# Patient Record
Sex: Female | Born: 1955 | Race: White | Hispanic: No | Marital: Married | Smoking: Current every day smoker
Health system: Southern US, Community
[De-identification: ages and names within clinical notes are randomized; demographics above are authoritative.]

## PROBLEM LIST (undated history)

## (undated) DIAGNOSIS — J45909 Unspecified asthma, uncomplicated: Secondary | ICD-10-CM

## (undated) DIAGNOSIS — I82409 Acute embolism and thrombosis of unspecified deep veins of unspecified lower extremity: Secondary | ICD-10-CM

## (undated) DIAGNOSIS — I809 Phlebitis and thrombophlebitis of unspecified site: Secondary | ICD-10-CM

## (undated) DIAGNOSIS — H9190 Unspecified hearing loss, unspecified ear: Secondary | ICD-10-CM

## (undated) DIAGNOSIS — F329 Major depressive disorder, single episode, unspecified: Secondary | ICD-10-CM

## (undated) DIAGNOSIS — F419 Anxiety disorder, unspecified: Secondary | ICD-10-CM

## (undated) DIAGNOSIS — I499 Cardiac arrhythmia, unspecified: Secondary | ICD-10-CM

## (undated) DIAGNOSIS — G43909 Migraine, unspecified, not intractable, without status migrainosus: Secondary | ICD-10-CM

## (undated) DIAGNOSIS — M1990 Unspecified osteoarthritis, unspecified site: Secondary | ICD-10-CM

## (undated) DIAGNOSIS — H539 Unspecified visual disturbance: Secondary | ICD-10-CM

## (undated) DIAGNOSIS — E079 Disorder of thyroid, unspecified: Secondary | ICD-10-CM

## (undated) DIAGNOSIS — F32A Depression, unspecified: Secondary | ICD-10-CM

## (undated) HISTORY — DX: Migraine, unspecified, not intractable, without status migrainosus: G43.909

## (undated) HISTORY — DX: Unspecified visual disturbance: H53.9

## (undated) HISTORY — DX: Unspecified hearing loss, unspecified ear: H91.90

## (undated) HISTORY — DX: Disorder of thyroid, unspecified: E07.9

## (undated) HISTORY — DX: Unspecified asthma, uncomplicated: J45.909

## (undated) HISTORY — DX: Cardiac arrhythmia, unspecified: I49.9

## (undated) HISTORY — DX: Major depressive disorder, single episode, unspecified: F32.9

## (undated) HISTORY — DX: Depression, unspecified: F32.A

## (undated) HISTORY — DX: Anxiety disorder, unspecified: F41.9

## (undated) HISTORY — DX: Unspecified osteoarthritis, unspecified site: M19.90

---

## 2000-06-11 ENCOUNTER — Emergency Department (HOSPITAL_COMMUNITY): Admission: EM | Admit: 2000-06-11 | Discharge: 2000-06-11 | Payer: Self-pay | Admitting: *Deleted

## 2011-10-03 ENCOUNTER — Encounter: Payer: Self-pay | Admitting: Internal Medicine

## 2011-10-10 ENCOUNTER — Ambulatory Visit: Payer: Self-pay | Admitting: Internal Medicine

## 2011-10-24 ENCOUNTER — Encounter: Payer: Self-pay | Admitting: Internal Medicine

## 2011-10-30 ENCOUNTER — Ambulatory Visit: Payer: Self-pay | Admitting: Internal Medicine

## 2012-01-17 ENCOUNTER — Ambulatory Visit: Payer: Self-pay

## 2012-02-26 ENCOUNTER — Ambulatory Visit: Payer: Self-pay | Admitting: Family Medicine

## 2012-02-26 ENCOUNTER — Encounter: Payer: Self-pay | Admitting: Family Medicine

## 2012-02-26 VITALS — BP 115/60 | HR 96 | Temp 98.1°F | Resp 18

## 2012-02-26 DIAGNOSIS — R059 Cough, unspecified: Secondary | ICD-10-CM

## 2012-02-26 DIAGNOSIS — G4700 Insomnia, unspecified: Secondary | ICD-10-CM

## 2012-02-26 DIAGNOSIS — J209 Acute bronchitis, unspecified: Secondary | ICD-10-CM

## 2012-02-26 DIAGNOSIS — R05 Cough: Secondary | ICD-10-CM

## 2012-02-26 MED ORDER — LORAZEPAM 2 MG PO TABS: 2.0000 mg | ORAL_TABLET | Freq: Four times a day (QID) | ORAL | Status: AC | PRN

## 2012-02-26 MED ORDER — ERYTHROMYCIN BASE 333 MG PO TBEC: 333.0000 mg | DELAYED_RELEASE_TABLET | Freq: Three times a day (TID) | ORAL | Status: DC

## 2012-02-26 MED ORDER — LORAZEPAM 1 MG PO TABS: 2.0000 mg | ORAL_TABLET | Freq: Three times a day (TID) | ORAL | Status: DC | PRN

## 2012-02-26 MED ORDER — PREDNISONE 20 MG PO TABS: ORAL_TABLET | ORAL | Status: DC

## 2012-02-26 NOTE — Patient Instructions (Signed)
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
## 2012-04-06 ENCOUNTER — Ambulatory Visit (INDEPENDENT_AMBULATORY_CARE_PROVIDER_SITE_OTHER): Payer: Self-pay | Admitting: Family Medicine

## 2012-04-06 VITALS — BP 115/68 | HR 102 | Temp 98.4°F | Resp 16 | Ht 63.5 in | Wt 114.4 lb

## 2012-04-06 DIAGNOSIS — F329 Major depressive disorder, single episode, unspecified: Secondary | ICD-10-CM

## 2012-04-06 DIAGNOSIS — F339 Major depressive disorder, recurrent, unspecified: Secondary | ICD-10-CM

## 2012-04-06 DIAGNOSIS — F32A Depression, unspecified: Secondary | ICD-10-CM

## 2012-04-06 DIAGNOSIS — H6090 Unspecified otitis externa, unspecified ear: Secondary | ICD-10-CM

## 2012-04-06 MED ORDER — ERYTHROMYCIN BASE 250 MG PO TABS: 250.0000 mg | ORAL_TABLET | Freq: Four times a day (QID) | ORAL | Status: DC

## 2012-04-06 NOTE — Progress Notes (Signed)
56 yo woman with joint pains, sinus congestion, and ongoing stress.  She is very discouraged.  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 Married, intermittently employed.  Epigastric pain was relieved by Nexium last month  O:  Alert, NAD  Patient is tearful. Skin clear of rashes Eyes: normal Oroph:  Clear posterior pharynx, poor dentition TM's:  Cerumen Neck:  Supple, no adenopathy, no thyromegaly Chest:  Clear Heart:  Reg, intermittent S4, no murmur Abd:  Soft, nontender, no HSM Ext:  Good pulses, no swollen or deformed joints  Mental status:  Difficult to evaluate, but clearly depressed and has given up trying to work.  A:  Depressed middle aged woman seeking disability  P:  [phone]  She needs social service help  Nexium 40 qd refilled.

## 2012-04-09 ENCOUNTER — Telehealth: Payer: Self-pay

## 2012-04-09 NOTE — Telephone Encounter (Signed)
Dr. Elbert Ewings to asses this as the note is unclear.  Loretta Copeland

## 2012-04-09 NOTE — Telephone Encounter (Signed)
Pharmacist called to ask if another med can be substituted for what dr l prescribed due to cost. Please call [facility]  [phone]

## 2012-04-10 MED ORDER — SULFAMETHOXAZOLE-TRIMETHOPRIM 800-160 MG PO TABS: 1.0000 | ORAL_TABLET | Freq: Two times a day (BID) | ORAL | Status: DC

## 2012-04-10 NOTE — Telephone Encounter (Signed)
Called Bactrim Rx into [facility]

## 2012-04-10 NOTE — Telephone Encounter (Signed)
Addended by: [name] on: 04/10/2012 04:01 PM   Modules accepted: Orders

## 2012-04-10 NOTE — Telephone Encounter (Signed)
Called pharm back and they stated the med that was too expensive was the erythromycin 250 that was Rxd 4/22. It costs over $100 and pt did not get it. Can we subst another Abx?

## 2012-04-10 NOTE — Telephone Encounter (Signed)
Please call in Prilosec 20 mg qd #30 with 5 refills and Prozac 20 mg #30 qd with 5 refills

## 2012-05-07 ENCOUNTER — Encounter: Payer: Self-pay | Admitting: Family Medicine

## 2012-08-24 ENCOUNTER — Ambulatory Visit (INDEPENDENT_AMBULATORY_CARE_PROVIDER_SITE_OTHER): Payer: Self-pay | Admitting: Family Medicine

## 2012-08-24 VITALS — BP 103/69 | HR 93 | Temp 97.9°F | Resp 16 | Ht 64.0 in | Wt 110.0 lb

## 2012-08-24 DIAGNOSIS — F411 Generalized anxiety disorder: Secondary | ICD-10-CM

## 2012-08-24 DIAGNOSIS — F419 Anxiety disorder, unspecified: Secondary | ICD-10-CM

## 2012-08-24 MED ORDER — LORAZEPAM 2 MG PO TABS: 2.0000 mg | ORAL_TABLET | Freq: Four times a day (QID) | ORAL | Status: DC | PRN

## 2012-08-24 MED ORDER — ERYTHROMYCIN BASE 333 MG PO TBEC: 333.0000 mg | DELAYED_RELEASE_TABLET | Freq: Three times a day (TID) | ORAL | Status: AC

## 2012-08-24 NOTE — Progress Notes (Signed)
56 yo woman with anxiety who is here for refill of Ativan 2 mg qid.  Objective:  NAD

## 2012-10-08 ENCOUNTER — Ambulatory Visit: Payer: Self-pay | Admitting: Family Medicine

## 2012-10-27 ENCOUNTER — Ambulatory Visit: Payer: Self-pay | Admitting: Family Medicine

## 2012-10-27 VITALS — BP 118/70 | HR 118 | Temp 98.6°F | Resp 18 | Ht 64.5 in | Wt 118.0 lb

## 2012-10-27 DIAGNOSIS — H9209 Otalgia, unspecified ear: Secondary | ICD-10-CM

## 2012-10-27 MED ORDER — NEOMYCIN-POLYMYXIN-HC 3.5-10000-1 OT SOLN: 3.0000 [drp] | Freq: Four times a day (QID) | OTIC | Status: DC

## 2012-10-27 MED ORDER — ERYTHROMYCIN BASE 333 MG PO TBEC: 333.0000 mg | DELAYED_RELEASE_TABLET | Freq: Three times a day (TID) | ORAL | Status: DC

## 2012-10-27 NOTE — Patient Instructions (Signed)
Otitis Media You or your child has otitis media. This is an infection of the middle chamber of the ear. This condition is common in young children and often follows upper respiratory infections. Symptoms of otitis media may include earache or ear fullness, hearing loss, or fever. If the eardrum ruptures, a middle ear infection may also cause bloody or pus-like discharge from the ear. Fussiness, irritability, and persistent crying may be the only signs of otitis media in small children. Otitis media can be caused by a bacteria or a virus. Antibiotics may be used to treat bacterial otitis media. But antibiotics are not effective against viral infections. Not every case of bacterial otitis media requires antibiotics and depending on age, severity of infection, and other risk factors, observation may be all that is required. Ear drops or oral medicines may be prescribed to reduce pain, fever, or congestion. Babies with ear infections should not be fed while lying on their backs. This increases the pressure and pain in the ear. Do not put cotton in the ear canal or clean it with cotton swabs. Swimming should be avoided if the eardrum has ruptured or if there is drainage from the ear canal. If your child experiences recurrent infections, your child may need to be referred to an Ear, Nose, and Throat specialist. HOME CARE INSTRUCTIONS   Take any antibiotic as directed by your caregiver. You or your child may feel better in a few days, but take all medicine or the infection may not respond and may become more difficult to treat.   Only take over-the-counter or prescription medicines for pain, discomfort, or fever as directed by your caregiver. Do not give aspirin to children.  Otitis media can lead to complications including rupture of the eardrum, long-term hearing loss, and more severe infections. Call your caregiver for follow-up care at the end of treatment. SEEK IMMEDIATE MEDICAL CARE IF:   Your or your  child's problems do not improve within 2 to 3 days.   You or your child has an oral temperature above 102 F (38.9 C), not controlled by medicine.   Your baby is older than 3 months with a rectal temperature of 102 F (38.9 C) or higher.   Your baby is 53 months old or younger with a rectal temperature of 100.4 F (38 C) or higher.   Your child develops increased fussiness.   You or your child develops a stiff neck, severe headache, or confusion.   There is swelling around the ear.   There is dizziness, vomiting, unusual sleepiness, seizures, or twitching of facial muscles.   The pain or ear drainage persists beyond 2 days of antibiotic treatment.  Document Released: 01/09/2005 Document Revised: 11/21/2011 Document Reviewed: 03/30/2010 [facility] Patient Information 2012 [place], [place].Otalgia The most common reason for this in children is an infection of the middle ear. Pain from the middle ear is usually caused by a build-up of fluid and pressure behind the eardrum. Pain from an earache can be sharp, dull, or burning. The pain may be temporary or constant. The middle ear is connected to the nasal passages by a short narrow tube called the Eustachian tube. The Eustachian tube allows fluid to drain out of the middle ear, and helps keep the pressure in your ear equalized. CAUSES  A cold or allergy can block the Eustachian tube with inflammation and the build-up of secretions. This is especially likely in small children, because their Eustachian tube is shorter and more horizontal. When the Eustachian tube  closes, the normal flow of fluid from the middle ear is stopped. Fluid can accumulate and cause stuffiness, pain, hearing loss, and an ear infection if germs start growing in this area. SYMPTOMS  The symptoms of an ear infection may include fever, ear pain, fussiness, increased crying, and irritability. Many children will have temporary and minor hearing loss during and right after an ear  infection. Permanent hearing loss is rare, but the risk increases the more infections a child has. Other causes of ear pain include retained water in the outer ear canal from swimming and bathing. Ear pain in adults is less likely to be from an ear infection. Ear pain may be referred from other locations. Referred pain may be from the joint between your jaw and the skull. It may also come from a tooth problem or problems in the neck. Other causes of ear pain include:  A foreign body in the ear.  Outer ear infection.  Sinus infections.  Impacted ear wax.  Ear injury.  Arthritis of the jaw or TMJ problems.  Middle ear infection.  Tooth infections.  Sore throat with pain to the ears. DIAGNOSIS  Your caregiver can usually make the diagnosis by examining you. Sometimes other special studies, including x-rays and lab work may be necessary. TREATMENT   If antibiotics were prescribed, use them as directed and finish them even if you or your child's symptoms seem to be improved.  Sometimes PE tubes are needed in children. These are little plastic tubes which are put into the eardrum during a simple surgical procedure. They allow fluid to drain easier and allow the pressure in the middle ear to equalize. This helps relieve the ear pain caused by pressure changes. HOME CARE INSTRUCTIONS   Only take over-the-counter or prescription medicines for pain, discomfort, or fever as directed by your caregiver. DO NOT GIVE CHILDREN ASPIRIN because of the association of Reye's Syndrome in children taking aspirin.  Use a cold pack applied to the outer ear for 15 to 20 minutes, 3 to 4 times per day or as needed may reduce pain. Do not apply ice directly to the skin. You may cause frost bite.  Over-the-counter ear drops used as directed may be effective. Your caregiver may sometimes prescribe ear drops.  Resting in an upright position may help reduce pressure in the middle ear and relieve pain.  Ear pain  caused by rapidly descending from high altitudes can be relieved by swallowing or chewing gum. Allowing infants to suck on a bottle during airplane travel can help.  Do not smoke in the house or near children. If you are unable to quit smoking, smoke outside.  Control allergies. SEEK IMMEDIATE MEDICAL CARE IF:   You or your child are becoming sicker.  Pain or fever relief is not obtained with medicine.  You or your child's symptoms (pain, fever, or irritability) do not improve within 24 to 48 hours or as instructed.  Severe pain suddenly stops hurting. This may indicate a ruptured eardrum.  You or your children develop new problems such as severe headaches, stiff neck, difficulty swallowing, or swelling of the face or around the ear. Document Released: 07/19/2004 Document Revised: 02/24/2012 Document Reviewed: 11/23/2008 [facility] Patient Information 2013 [place], [place].

## 2012-10-27 NOTE — Progress Notes (Signed)
56 yo woman with left ear pain x 4 days. No fever  Patient prefers erythromycin  O: left ear retracted, mildly erythematous  A:  Otitis media

## 2012-12-16 DIAGNOSIS — Z0271 Encounter for disability determination: Secondary | ICD-10-CM

## 2013-03-02 ENCOUNTER — Ambulatory Visit: Payer: Self-pay | Admitting: Family Medicine

## 2013-03-02 VITALS — BP 106/78 | HR 89 | Temp 98.2°F | Resp 18 | Ht 64.25 in | Wt 121.6 lb

## 2013-03-02 DIAGNOSIS — F419 Anxiety disorder, unspecified: Secondary | ICD-10-CM

## 2013-03-02 DIAGNOSIS — F411 Generalized anxiety disorder: Secondary | ICD-10-CM

## 2013-03-02 DIAGNOSIS — H9203 Otalgia, bilateral: Secondary | ICD-10-CM

## 2013-03-02 DIAGNOSIS — H9209 Otalgia, unspecified ear: Secondary | ICD-10-CM

## 2013-03-02 MED ORDER — LORAZEPAM 2 MG PO TABS: 2.0000 mg | ORAL_TABLET | Freq: Four times a day (QID) | ORAL | Status: DC | PRN

## 2013-03-02 MED ORDER — LORAZEPAM 1 MG PO TABS: ORAL_TABLET | ORAL | Status: DC

## 2013-03-02 MED ORDER — ERYTHROMYCIN BASE 333 MG PO TBEC: 333.0000 mg | DELAYED_RELEASE_TABLET | Freq: Three times a day (TID) | ORAL | Status: DC

## 2013-03-02 NOTE — Progress Notes (Signed)
This is a 57 yo unhappy woman from the [place] who comes in about every 6 months for a refill on her Ativan 2 mg qid.  She is in poor health, smokes at least a pack a day, and has an unhappy marriage.  O:  NAD Edentulous, no lesions in mouth Neck:  No adenopathy Chest: clear Heart: reg, no murmur Skin: no rash  Assessment:  Chronic sinusitis, chronic anxiety.

## 2013-05-26 ENCOUNTER — Encounter: Payer: Self-pay | Admitting: Internal Medicine

## 2013-06-01 ENCOUNTER — Other Ambulatory Visit: Payer: Self-pay | Admitting: Obstetrics and Gynecology

## 2013-06-01 DIAGNOSIS — R2232 Localized swelling, mass and lump, left upper limb: Secondary | ICD-10-CM

## 2013-06-03 ENCOUNTER — Ambulatory Visit (INDEPENDENT_AMBULATORY_CARE_PROVIDER_SITE_OTHER): Payer: No Typology Code available for payment source | Admitting: Family Medicine

## 2013-06-03 VITALS — BP 106/64 | HR 99 | Temp 98.2°F | Resp 16 | Ht 65.0 in | Wt 112.4 lb

## 2013-06-03 DIAGNOSIS — H6690 Otitis media, unspecified, unspecified ear: Secondary | ICD-10-CM

## 2013-06-03 DIAGNOSIS — F419 Anxiety disorder, unspecified: Secondary | ICD-10-CM

## 2013-06-03 DIAGNOSIS — R109 Unspecified abdominal pain: Secondary | ICD-10-CM

## 2013-06-03 DIAGNOSIS — H6692 Otitis media, unspecified, left ear: Secondary | ICD-10-CM

## 2013-06-03 DIAGNOSIS — F411 Generalized anxiety disorder: Secondary | ICD-10-CM

## 2013-06-03 LAB — POCT URINALYSIS DIPSTICK
Blood, UA: NEGATIVE
Glucose, UA: NEGATIVE
Ketones, UA: 15
Leukocytes, UA: NEGATIVE
Nitrite, UA: NEGATIVE
Spec Grav, UA: >=1.03
Urobilinogen, UA: 0.2
pH, UA: 5

## 2013-06-03 MED ORDER — LORAZEPAM 2 MG PO TABS: 2.0000 mg | ORAL_TABLET | Freq: Four times a day (QID) | ORAL | Status: DC | PRN

## 2013-06-03 NOTE — Patient Instructions (Signed)
Take Mylanta 1 tbsp (15 ml) three times a day

## 2013-06-03 NOTE — Progress Notes (Signed)
57 yo woman with epigastric discomfort since this morning.  She says she needs refill but I gave her 5 refills last visit.  She  Is taking lorazepam 2 mg every 6 hours.  She has taken prilosec which hasn't worked.  Unable to afford the Nexium.  Daughter and 49 mo old son are living with her.  She notes baby is not sleeping well and keeping her awake.  Objective:  Difficult to understand, but patient in no distress.  She seems unhappy having daughter with her Chest:  Clear Heart: regular, no murmur Abdomen: mild tenderness hypogastrium, no mass, no guarding, no rebound Skin: no rash Bilateral TM erythema and retraction Results for orders placed in visit on 06/03/13  POCT URINALYSIS DIPSTICK      Result Value Range   Color, UA yellow     Clarity, UA clear     Glucose, UA neg     Bilirubin, UA small     Ketones, UA 15     Spec Grav, UA >=1.030     Blood, UA neg     pH, UA 5.0     Protein, UA trace     Urobilinogen, UA 0.2     Nitrite, UA neg     Leukocytes, UA Negative      Assessment:  Poor health secondary to poor diet and smoking.  Lower abdominal pain which is mild and should resolve. Otitis infection  Plan: Continue the lorazepam 2 mg qid prn Mylanta cipro 250 bid x 10 days for ear  Signed, [name], Md

## 2013-06-14 ENCOUNTER — Other Ambulatory Visit: Payer: Self-pay

## 2013-06-21 ENCOUNTER — Ambulatory Visit: Payer: Self-pay | Admitting: Internal Medicine

## 2013-08-31 ENCOUNTER — Ambulatory Visit (INDEPENDENT_AMBULATORY_CARE_PROVIDER_SITE_OTHER): Payer: No Typology Code available for payment source | Admitting: Family Medicine

## 2013-08-31 ENCOUNTER — Ambulatory Visit (INDEPENDENT_AMBULATORY_CARE_PROVIDER_SITE_OTHER): Payer: No Typology Code available for payment source

## 2013-08-31 VITALS — BP 114/68 | HR 95 | Temp 99.6°F | Resp 17 | Ht 65.0 in | Wt 111.0 lb

## 2013-08-31 DIAGNOSIS — R05 Cough: Secondary | ICD-10-CM

## 2013-08-31 DIAGNOSIS — R059 Cough, unspecified: Secondary | ICD-10-CM

## 2013-08-31 DIAGNOSIS — J209 Acute bronchitis, unspecified: Secondary | ICD-10-CM

## 2013-08-31 DIAGNOSIS — H9203 Otalgia, bilateral: Secondary | ICD-10-CM

## 2013-08-31 DIAGNOSIS — F419 Anxiety disorder, unspecified: Secondary | ICD-10-CM

## 2013-08-31 DIAGNOSIS — F411 Generalized anxiety disorder: Secondary | ICD-10-CM

## 2013-08-31 DIAGNOSIS — H9209 Otalgia, unspecified ear: Secondary | ICD-10-CM

## 2013-08-31 MED ORDER — ERYTHROMYCIN BASE 333 MG PO TBEC: 333.0000 mg | DELAYED_RELEASE_TABLET | Freq: Three times a day (TID) | ORAL | Status: DC

## 2013-08-31 MED ORDER — NEOMYCIN-POLYMYXIN-HC 3.5-10000-1 OT SOLN: 3.0000 [drp] | Freq: Four times a day (QID) | OTIC | Status: DC

## 2013-08-31 MED ORDER — LORAZEPAM 2 MG PO TABS: 2.0000 mg | ORAL_TABLET | Freq: Four times a day (QID) | ORAL | Status: DC | PRN

## 2013-08-31 MED ORDER — HYDROCODONE-HOMATROPINE 5-1.5 MG/5ML PO SYRP: 5.0000 mL | ORAL_SOLUTION | Freq: Three times a day (TID) | ORAL | Status: DC | PRN

## 2013-08-31 NOTE — Progress Notes (Signed)
Patient ID: Loretta Copeland MRN: [id number], DOB: September 15, 1956, 57 y.o. Date of Encounter: 08/31/2013, 6:48 PM  Primary Physician: No PCP Per Patient  Chief Complaint:  Chief Complaint  Patient presents with  . Headache    HPI: 57 y.o. year old female presents with 14 day history of nasal congestion, post nasal drip, sore throat, sinus pressure, and cough. Afebrile. No chills. Nasal congestion thick and green/yellow. Sinus pressure is the worst symptom. Cough is productive secondary to post nasal drip and not associated with time of day. Ears feel full, leading to sensation of muffled hearing. Has tried OTC cold preps without success. No GI complaints. Appetite poor  No recent antibiotics, recent travels, or sick contacts   No leg trauma, sedentary periods, h/o cancer, or tobacco use.  Past Medical History  Diagnosis Date  . Migraines   . Arthritis   . Depression      Home Meds: Prior to Admission medications   Medication Sig Start Date End Date Taking? Authorizing Provider  LORazepam (ATIVAN) 2 MG tablet Take 1 tablet (2 mg total) by mouth every 6 (six) hours as needed for anxiety. 06/03/13  Yes [name], MD    Allergies:  Allergies  Allergen Reactions  . Penicillins   . Tetracyclines & Related     History   Social History  . Marital Status: Married    Spouse Name: N/A    Number of Children: N/A  . Years of Education: N/A   Occupational History  . Not on file.   Social History Main Topics  . Smoking status: Current Every Day Smoker -- 1.00 packs/day for 30 years    Types: Cigarettes  . Smokeless tobacco: Not on file  . Alcohol Use: No  . Drug Use: No  . Sexual Activity: No   Other Topics Concern  . Not on file   Social History Narrative  . No narrative on file     Review of Systems: Constitutional: negative for chills, fever, night sweats or weight changes Cardiovascular: negative for chest pain or palpitations Respiratory: negative for  hemoptysis, wheezing, or shortness of breath Abdominal: negative for abdominal pain, nausea, vomiting or diarrhea Dermatological: negative for rash Neurologic: negative for headache   Physical Exam: Blood pressure 114/68, pulse 95, temperature 99.6 F (37.6 C), temperature source Oral, resp. rate 17, height 5\' 5"  (1.651 m), weight 111 lb (50.349 kg), SpO2 99.00%., Body mass index is 18.47 kg/(m^2). General: Well developed, well nourished, in no acute distress. Head: Normocephalic, atraumatic, eyes without discharge, sclera non-icteric, nares are congested. Bilateral auditory canals clear, TM's are without perforation, pearly grey with reflective cone of light bilaterally. Serous effusion bilaterally behind TM's. Maxillary sinus TTP. Oral cavity moist, dentition normal. Posterior pharynx with post nasal drip and mild erythema. No peritonsillar abscess or tonsillar exudate. Neck: Supple. No thyromegaly. Full ROM. No lymphadenopathy. Lungs: bilateral to auscultation wit wheezes, no rales, or rhonchi. Breathing is unlabored.  Heart: RRR with S1 S2. No murmurs, rubs, or gallops appreciated. Msk:  Strength and tone normal for age. Extremities: No clubbing or cyanosis. No edema. Neuro: Alert and oriented X 3. Moves all extremities spontaneously. CNII-XII grossly in tact. Psych:  Responds to questions appropriately with a normal affect.   Labs:   ASSESSMENT AND PLAN:  57 y.o. year old female with sinusitis, anxiety, depression Otalgia, bilateral - Plan: erythromycin (ERY-TAB) 333 MG EC tablet, neomycin-polymyxin-hydrocortisone (CORTISPORIN) otic solution  Anxiety - Plan: LORazepam (ATIVAN) 2 MG tablet  Cough -  Plan: HYDROcodone-homatropine (HYCODAN) 5-1.5 MG/5ML syrup   -  -Tylenol/Motrin prn -Rest/fluids -RTC precautions -RTC 3-5 days if no improvement  Signed, [name], MD 08/31/2013 6:48 PM

## 2013-10-13 ENCOUNTER — Encounter: Payer: No Typology Code available for payment source | Admitting: Family Medicine

## 2013-12-22 ENCOUNTER — Ambulatory Visit (INDEPENDENT_AMBULATORY_CARE_PROVIDER_SITE_OTHER): Payer: BC Managed Care – PPO | Admitting: Family Medicine

## 2013-12-22 VITALS — BP 122/80 | HR 95 | Temp 98.6°F | Resp 16 | Ht 65.0 in | Wt 115.0 lb

## 2013-12-22 DIAGNOSIS — R059 Cough, unspecified: Secondary | ICD-10-CM

## 2013-12-22 DIAGNOSIS — H9209 Otalgia, unspecified ear: Secondary | ICD-10-CM

## 2013-12-22 DIAGNOSIS — F411 Generalized anxiety disorder: Secondary | ICD-10-CM

## 2013-12-22 DIAGNOSIS — R05 Cough: Secondary | ICD-10-CM

## 2013-12-22 DIAGNOSIS — F419 Anxiety disorder, unspecified: Secondary | ICD-10-CM

## 2013-12-22 DIAGNOSIS — J329 Chronic sinusitis, unspecified: Secondary | ICD-10-CM

## 2013-12-22 DIAGNOSIS — H9203 Otalgia, bilateral: Secondary | ICD-10-CM

## 2013-12-22 MED ORDER — ERYTHROMYCIN BASE 333 MG PO TBEC: 333.0000 mg | DELAYED_RELEASE_TABLET | Freq: Three times a day (TID) | ORAL | Status: DC

## 2013-12-22 MED ORDER — LORAZEPAM 2 MG PO TABS: 2.0000 mg | ORAL_TABLET | Freq: Four times a day (QID) | ORAL | Status: DC | PRN

## 2013-12-22 MED ORDER — HYDROCODONE-HOMATROPINE 5-1.5 MG/5ML PO SYRP: 5.0000 mL | ORAL_SOLUTION | Freq: Three times a day (TID) | ORAL | Status: DC | PRN

## 2013-12-22 MED ORDER — SULFAMETHOXAZOLE-TRIMETHOPRIM 800-160 MG PO TABS: 1.0000 | ORAL_TABLET | Freq: Two times a day (BID) | ORAL | Status: DC

## 2013-12-22 NOTE — Patient Instructions (Signed)

## 2013-12-22 NOTE — Progress Notes (Signed)
Subjective:  This chart was scribed for Loretta SidleKurt Lauenstein, MD by Loretta Copeland, Medical Scribe. This patient was seen in Room 3 and the patient's care was started at 6:20 PM.   Patient ID: Loretta Copeland, female    DOB: 30-Jan-1956, [age] y.o.   MRN: [id number]  HPI HPI Comments: Loretta AllegraBiljana Copeland is a 58 y.o. female who presents to the [facility] complaining of constant nasal congestion and sinus pressure.  The patient lists myalgias and bilateral otalgia as associated symptoms.  The patient is also complaining of constant right shoulder pain that started a three days ago.  The patient lists back pain and neck pain as associated symptoms.  The patient states that she also needs a refill of Ativan.  She states that the Erythromycin causes her to have abdominal pain and she would like to try another medication.  The patient states that she is allergic to Penicillins and Tetracyclines.   Past Medical History  Diagnosis Date   Migraines    Arthritis    Depression    History reviewed. No pertinent past surgical history. Family History  Problem Relation Age of Onset   Diabetes Mother    Diabetes Father    Diabetes Brother    Diabetes Daughter    History   Social History   Marital Status: Married    Spouse Name: N/A    Number of Children: N/A   Years of Education: N/A   Occupational History   Not on file.   Social History Main Topics   Smoking status: Current Every Day Smoker -- 1.00 packs/day for 30 years    Types: Cigarettes   Smokeless tobacco: Not on file   Alcohol Use: No   Drug Use: No   Sexual Activity: No   Other Topics Concern   Not on file   Social History Narrative   No narrative on file   Allergies  Allergen Reactions   Penicillins    Tetracyclines & Related      Review of Systems  HENT: Positive for congestion, ear pain (bilateral) and sinus pressure.   Musculoskeletal: Positive for arthralgias (right shoulder), back  pain, myalgias and neck pain.  All other systems reviewed and are negative.     Objective:  Physical Exam  Nursing note and vitals reviewed. Constitutional: She is oriented to person, place, and time. She appears well-developed and well-nourished. No distress.  HENT:  Head: Normocephalic and atraumatic.  Eyes: EOM are normal. Pupils are equal, round, and reactive to light.  Neck: Normal range of motion and phonation normal.  Cardiovascular: Normal rate.   Pulmonary/Chest: Effort normal.  Abdominal: Soft.  Musculoskeletal: Normal range of motion.  Neurological: She is alert and oriented to person, place, and time. No cranial nerve deficit. She exhibits normal muscle tone. Coordination normal.  Skin: Skin is warm and dry.  Psychiatric: She has a normal mood and affect. Her behavior is normal. Judgment and thought content normal.       BP 122/80   Pulse 95   Temp(Src) 98.6 F (37 C) (Oral)   Resp 16   Ht 5\' 5"  (1.651 m)   Wt 115 lb (52.164 kg)   BMI 19.14 kg/m2   SpO2 96% Assessment & Plan:   Meds ordered this encounter  Medications   LORazepam (ATIVAN) 2 MG tablet    Sig: Take 1 tablet (2 mg total) by mouth every 6 (six) hours as needed for anxiety.    Dispense:  120  tablet    Refill:  5   DISCONTD: sulfamethoxazole-trimethoprim (BACTRIM DS,SEPTRA DS) 800-160 MG per tablet    Sig: Take 1 tablet by mouth 2 (two) times daily.    Dispense:  20 tablet    Refill:  0   HYDROcodone-homatropine (HYCODAN) 5-1.5 MG/5ML syrup    Sig: Take 5 mLs by mouth every 8 (eight) hours as needed for cough.    Dispense:  120 mL    Refill:  0   erythromycin (ERY-TAB) 333 MG EC tablet    Sig: Take 1 tablet (333 mg total) by mouth 3 (three) times daily.    Dispense:  21 tablet    Refill:  0    1. Anxiety   2. Otalgia, bilateral   3. Sinusitis    I personally performed the services described in this documentation, which was scribed in my presence. The recorded information has been  reviewed and is accurate.  Loretta Sidle, MD

## 2013-12-29 ENCOUNTER — Ambulatory Visit: Payer: No Typology Code available for payment source | Admitting: Family Medicine

## 2014-01-06 ENCOUNTER — Encounter: Payer: No Typology Code available for payment source | Admitting: Family Medicine

## 2014-03-17 ENCOUNTER — Ambulatory Visit: Payer: BC Managed Care – PPO | Admitting: Family Medicine

## 2014-03-18 NOTE — Progress Notes (Signed)
Not seen

## 2014-05-17 ENCOUNTER — Telehealth: Payer: Self-pay

## 2014-05-17 NOTE — Telephone Encounter (Signed)
LMOM that I didn't see where anyone called her and to give [place] a call back

## 2014-05-17 NOTE — Telephone Encounter (Signed)
Patient states she received a missed call. Please return call and advise. Thank you.

## 2014-06-07 ENCOUNTER — Ambulatory Visit (INDEPENDENT_AMBULATORY_CARE_PROVIDER_SITE_OTHER): Payer: BC Managed Care – PPO | Admitting: Family Medicine

## 2014-06-07 VITALS — BP 100/60 | HR 96 | Temp 98.9°F | Resp 24 | Ht 64.0 in | Wt 117.8 lb

## 2014-06-07 DIAGNOSIS — H6060 Unspecified chronic otitis externa, unspecified ear: Secondary | ICD-10-CM

## 2014-06-07 DIAGNOSIS — R05 Cough: Secondary | ICD-10-CM

## 2014-06-07 DIAGNOSIS — H6062 Unspecified chronic otitis externa, left ear: Secondary | ICD-10-CM

## 2014-06-07 DIAGNOSIS — F419 Anxiety disorder, unspecified: Secondary | ICD-10-CM

## 2014-06-07 DIAGNOSIS — F411 Generalized anxiety disorder: Secondary | ICD-10-CM

## 2014-06-07 DIAGNOSIS — H608X9 Other otitis externa, unspecified ear: Secondary | ICD-10-CM

## 2014-06-07 DIAGNOSIS — R059 Cough, unspecified: Secondary | ICD-10-CM

## 2014-06-07 MED ORDER — SULFAMETHOXAZOLE-TRIMETHOPRIM 800-160 MG PO TABS: 1.0000 | ORAL_TABLET | Freq: Two times a day (BID) | ORAL | Status: DC

## 2014-06-07 MED ORDER — HYDROCODONE-HOMATROPINE 5-1.5 MG/5ML PO SYRP: 5.0000 mL | ORAL_SOLUTION | Freq: Three times a day (TID) | ORAL | Status: DC | PRN

## 2014-06-07 MED ORDER — LORAZEPAM 2 MG PO TABS: 2.0000 mg | ORAL_TABLET | Freq: Four times a day (QID) | ORAL | Status: DC | PRN

## 2014-06-07 NOTE — Progress Notes (Addendum)
° °  Subjective:    Patient ID: Loretta Copeland, female    DOB: 08/19/1956, 58 y.o.   MRN: [id number] This chart was scribed for Elvina SidleKurt Lauenstein, MD by Evon Slackerrance Branch, ED Scribe. This Patient was seen in room 01 and the patients care was started at 7:52 PM  HPI Loretta Copeland is a 58 y.o. female She present to urgent medical with a disability papers. She states she has a history of chronic back pain. She states she would like help with the chronic back pain. She states she also has some pain in the right leg. She was previously diagnosed wit sciatica. She also present with a knee wrap on the right knee to help with pain. She states she also states she has been having ear pain in her left ear with associated discharge.    Review of Systems  HENT: Positive for ear discharge and ear pain.   Musculoskeletal: Positive for arthralgias and back pain.     Objective:    Physical Exam  HENT:  Left Ear: Tympanic membrane is perforated.   Bilateral wheezes inspiratory and respiratory  Mildly sore in LLQ Positive straight leg raise on left Pain with flexion on right knee, diminished reflex in left ankle jerk compared to right ankle No significant muscle wasting of either leg   Assessment & Plan:   Cough - Plan: HYDROcodone-homatropine (HYCODAN) 5-1.5 MG/5ML syrup  Anxiety - Plan: LORazepam (ATIVAN) 2 MG tablet Septra DS 1 twice a day for week has been called in for the urine infection Signed, Elvina SidleKurt Lauenstein, MD   Cover letter for the patient certifying that she is disabled because of her multiple problems.

## 2014-11-21 ENCOUNTER — Ambulatory Visit (INDEPENDENT_AMBULATORY_CARE_PROVIDER_SITE_OTHER): Payer: BC Managed Care – PPO | Admitting: Family Medicine

## 2014-11-21 VITALS — BP 128/80 | HR 87 | Temp 99.3°F | Resp 16 | Ht 64.5 in | Wt 134.4 lb

## 2014-11-21 DIAGNOSIS — R05 Cough: Secondary | ICD-10-CM

## 2014-11-21 DIAGNOSIS — F419 Anxiety disorder, unspecified: Secondary | ICD-10-CM

## 2014-11-21 DIAGNOSIS — H65493 Other chronic nonsuppurative otitis media, bilateral: Secondary | ICD-10-CM

## 2014-11-21 DIAGNOSIS — R059 Cough, unspecified: Secondary | ICD-10-CM

## 2014-11-21 MED ORDER — SULFAMETHOXAZOLE-TRIMETHOPRIM 800-160 MG PO TABS: 1.0000 | ORAL_TABLET | Freq: Two times a day (BID) | ORAL | Status: DC

## 2014-11-21 MED ORDER — HYDROCODONE-HOMATROPINE 5-1.5 MG/5ML PO SYRP: 5.0000 mL | ORAL_SOLUTION | Freq: Three times a day (TID) | ORAL | Status: DC | PRN

## 2014-11-21 MED ORDER — LORAZEPAM 2 MG PO TABS: 2.0000 mg | ORAL_TABLET | Freq: Four times a day (QID) | ORAL | Status: DC | PRN

## 2014-11-21 NOTE — Progress Notes (Signed)
° °  Subjective:    Patient ID: Loretta Copeland, female    DOB: 1956-09-23, 58 y.o.   MRN: [id number]  HPI Chief Complaint  Patient presents with   Ear Pain   This chart was scribed for Elvina SidleKurt Lauenstein, MD by Andrew Auaven Small, ED Scribe. This patient was seen in room  and the patient's care was started at 6:24 PM.  HPI Comments: Loretta Copeland is a 58 y.o. female who presents to the [facility] complaining of bilateral otalgia with associated cough. Pt has hx of chronic otitis media.   Pt is also requesting a refill of Ativan.   Past Medical History  Diagnosis Date   Migraines    Arthritis    Depression    Allergies  Allergen Reactions   Penicillins    Tetracyclines & Related    Prior to Admission medications   Medication Sig Start Date End Date Taking? Authorizing Provider  HYDROcodone-homatropine (HYCODAN) 5-1.5 MG/5ML syrup Take 5 mLs by mouth every 8 (eight) hours as needed for cough. 11/21/14  Yes Elvina SidleKurt Lauenstein, MD  LORazepam (ATIVAN) 2 MG tablet Take 1 tablet (2 mg total) by mouth every 6 (six) hours as needed for anxiety. 11/21/14  Yes Elvina SidleKurt Lauenstein, MD  sulfamethoxazole-trimethoprim (SEPTRA DS) 800-160 MG per tablet Take 1 tablet by mouth 2 (two) times daily. 11/21/14  Yes Elvina SidleKurt Lauenstein, MD    Review of Systems  HENT: Positive for ear pain.   Respiratory: Positive for cough.        Objective:   Physical Exam  Constitutional: She is oriented to person, place, and time. She appears well-developed and well-nourished. No distress.  HENT:  Head: Normocephalic and atraumatic.  Eyes: Conjunctivae and EOM are normal.  Neck: Neck supple.  Cardiovascular: Normal rate.   Pulmonary/Chest: Effort normal.  Musculoskeletal: Normal range of motion.  Neurological: She is alert and oriented to person, place, and time.  Skin: Skin is warm and dry.  Psychiatric: She has a normal mood and affect. Her behavior is normal.  Nursing note and vitals  reviewed.  Moderate thickening both TM's with retraction       Assessment & Plan:   1. Anxiety   2. 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 Cough    Meds ordered this encounter  Medications   LORazepam (ATIVAN) 2 MG tablet    Sig: Take 1 tablet (2 mg total) by mouth every 6 (six) hours as needed for anxiety.    Dispense:  120 tablet    Refill:  5   sulfamethoxazole-trimethoprim (SEPTRA DS) 800-160 MG per tablet    Sig: Take 1 tablet by mouth 2 (two) times daily.    Dispense:  14 tablet    Refill:  3   HYDROcodone-homatropine (HYCODAN) 5-1.5 MG/5ML syrup    Sig: Take 5 mLs by mouth every 8 (eight) hours as needed for cough.    Dispense:  120 mL    Refill:  0    This chart was scribed in my presence and reviewed by me personally. Elvina SidleKurt Lauenstein

## 2015-04-18 ENCOUNTER — Ambulatory Visit (INDEPENDENT_AMBULATORY_CARE_PROVIDER_SITE_OTHER): Payer: BLUE CROSS/BLUE SHIELD | Admitting: Family Medicine

## 2015-04-18 VITALS — BP 130/70 | HR 92 | Temp 97.9°F | Ht 64.5 in | Wt 138.4 lb

## 2015-04-18 DIAGNOSIS — F419 Anxiety disorder, unspecified: Secondary | ICD-10-CM | POA: Diagnosis not present

## 2015-04-18 DIAGNOSIS — R059 Cough, unspecified: Secondary | ICD-10-CM

## 2015-04-18 DIAGNOSIS — H65192 Other acute nonsuppurative otitis media, left ear: Secondary | ICD-10-CM

## 2015-04-18 DIAGNOSIS — R05 Cough: Secondary | ICD-10-CM | POA: Diagnosis not present

## 2015-04-18 MED ORDER — LEVOFLOXACIN 500 MG PO TABS: 500.0000 mg | ORAL_TABLET | Freq: Every day | ORAL | Status: DC

## 2015-04-18 MED ORDER — HYDROCODONE-HOMATROPINE 5-1.5 MG/5ML PO SYRP: 5.0000 mL | ORAL_SOLUTION | Freq: Three times a day (TID) | ORAL | Status: DC | PRN

## 2015-04-18 MED ORDER — LORAZEPAM 2 MG PO TABS: 2.0000 mg | ORAL_TABLET | Freq: Four times a day (QID) | ORAL | Status: DC | PRN

## 2015-04-18 NOTE — Patient Instructions (Signed)

## 2015-04-18 NOTE — Progress Notes (Signed)
Subjective:  This chart was scribed for [name] MD, by Veverly Fells, at [facility].  This patient was seen in room 2 and the patient's care was started at 6:10 PM.    Patient ID: Loretta Copeland, female    DOB: Dec 15, 1956, 59 y.o.   MRN: [id number] Chief Complaint  Patient presents with   Ear Pain    c/o left ear problems off & on for a while (some pain)    HPI  HPI Comments: Loretta Copeland is a 59 y.o. female who presents to [facility] for intermittent left ear pain onset a while ago as well as a cough.  She notes that she has tried drops but they have not helped her yet.  Patient notes that she is happy with her Lorazepam which she is compliant with. She is also requesting cough syrup for her cough. She has no other complaints today.    There are no active problems to display for this patient.  Past Medical History  Diagnosis Date   Migraines    Arthritis    Depression    No past surgical history on file. Allergies  Allergen Reactions   Penicillins    Tetracyclines & Related    Prior to Admission medications   Medication Sig Start Date End Date Taking? Authorizing Provider  LORazepam (ATIVAN) 2 MG tablet Take 1 tablet (2 mg total) by mouth every 6 (six) hours as needed for anxiety. 11/21/14  Yes [name], MD   History   Social History   Marital Status: Married    Spouse Name: N/A   Number of Children: N/A   Years of Education: N/A   Occupational History   Not on file.   Social History Main Topics   Smoking status: Current Every Day Smoker -- 1.00 packs/day for 30 years    Types: Cigarettes   Smokeless tobacco: Not on file     Comment: not smoking every day   Alcohol Use: No   Drug Use: No   Sexual Activity: No   Other Topics Concern   Not on file   Social History Narrative     Review of Systems  Constitutional: Negative for fever, chills and diaphoresis.  HENT: Positive for  ear pain.   Respiratory: Positive for cough. Negative for choking.   Gastrointestinal: Negative for nausea and vomiting.       Objective:   Physical Exam  Constitutional: She appears well-developed and well-nourished. No distress.  HENT:  Head: Normocephalic and atraumatic.  Eyes: Right eye exhibits no discharge. Left eye exhibits no discharge.  Pulmonary/Chest: Effort normal. No respiratory distress.  Neurological: She is alert. Coordination normal.  Skin: No rash noted. She is not diaphoretic.  Psychiatric: She has a normal mood and affect. Her behavior is normal.  Nursing note and vitals reviewed.  left ear retracted with redness over the malleus  Filed Vitals:   04/18/15 1753  BP: 130/70  Pulse: 92  Temp: 97.9 F (36.6 C)  TempSrc: Oral  Height: 5' 4.5" (1.638 m)  Weight: 138 lb 6 oz (62.766 kg)  SpO2: 97%       Assessment & Plan:   This chart was scribed in my presence and reviewed by me personally.    ICD-9-CM ICD-10-CM   1. Acute nonsuppurative otitis media of left ear 381.00 H65.192 levofloxacin (LEVAQUIN) 500 MG tablet     DISCONTINUED: levofloxacin (LEVAQUIN) 500 MG tablet  2. Anxiety 300.00 F41.9 LORazepam (  ATIVAN) 2 MG tablet  3. Cough 786.2 R05 HYDROcodone-homatropine (HYCODAN) 5-1.5 MG/5ML syrup     Signed, Elvina SidleKurt Lauenstein, MD

## 2015-07-22 ENCOUNTER — Ambulatory Visit (INDEPENDENT_AMBULATORY_CARE_PROVIDER_SITE_OTHER): Payer: BLUE CROSS/BLUE SHIELD | Admitting: Emergency Medicine

## 2015-07-22 ENCOUNTER — Telehealth: Payer: Self-pay | Admitting: Family Medicine

## 2015-07-22 ENCOUNTER — Ambulatory Visit (INDEPENDENT_AMBULATORY_CARE_PROVIDER_SITE_OTHER): Payer: BLUE CROSS/BLUE SHIELD

## 2015-07-22 ENCOUNTER — Other Ambulatory Visit: Payer: Self-pay | Admitting: Emergency Medicine

## 2015-07-22 VITALS — BP 128/70 | HR 118 | Temp 97.8°F | Resp 18 | Ht 64.0 in | Wt 136.6 lb

## 2015-07-22 DIAGNOSIS — J209 Acute bronchitis, unspecified: Secondary | ICD-10-CM | POA: Diagnosis not present

## 2015-07-22 DIAGNOSIS — R05 Cough: Secondary | ICD-10-CM | POA: Diagnosis not present

## 2015-07-22 DIAGNOSIS — R918 Other nonspecific abnormal finding of lung field: Secondary | ICD-10-CM

## 2015-07-22 DIAGNOSIS — R059 Cough, unspecified: Secondary | ICD-10-CM

## 2015-07-22 MED ORDER — HYDROCOD POLST-CPM POLST ER 10-8 MG/5ML PO SUER: 5.0000 mL | Freq: Two times a day (BID) | ORAL | Status: DC

## 2015-07-22 MED ORDER — CLARITHROMYCIN 500 MG PO TABS: 500.0000 mg | ORAL_TABLET | Freq: Two times a day (BID) | ORAL | Status: DC

## 2015-07-22 NOTE — Patient Instructions (Signed)

## 2015-07-22 NOTE — Telephone Encounter (Signed)
Per Dr. 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 We will get CT scheduled and once we do we will let her know. I called patient, language barrier, she said she would have daughter call. Will call back if I have not heard back from her or her daughter.

## 2015-07-22 NOTE — Progress Notes (Signed)
Subjective:  Patient ID: Loretta Copeland, female    DOB: 1956-08-23  Age: 59 y.o. MRN: [id number]  CC: Abdominal Pain   HPI Loretta Copeland presents  with a history of a cough over the last 10 days. She has no wheezing or shortness breath. She expectorates scant sputum. She has no fever or chills. No nausea or vomiting. She has left lateral chest wall pain. With associated with coughing. No nasal congestion postnasal drainage or sore throat. She's had no improvement with over-the-counter medication.  History Loretta Copeland has a past medical history of Migraines; Arthritis; and Depression.   She has no past surgical history on file.   Her  family history includes Diabetes in her brother, daughter, father, and mother.  She   reports that she has been smoking Cigarettes.  She has a 30 pack-year smoking history. She does not have any smokeless tobacco history on file. She reports that she does not drink alcohol or use illicit drugs.  Outpatient Prescriptions Prior to Visit  Medication Sig Dispense Refill  . LORazepam (ATIVAN) 2 MG tablet Take 1 tablet (2 mg total) by mouth every 6 (six) hours as needed for anxiety. 120 tablet 5  . HYDROcodone-homatropine (HYCODAN) 5-1.5 MG/5ML syrup Take 5 mLs by mouth every 8 (eight) hours as needed for cough. (Patient not taking: Reported on 07/22/2015) 120 mL 0  . levofloxacin (LEVAQUIN) 500 MG tablet Take 1 tablet (500 mg total) by mouth daily. (Patient not taking: Reported on 07/22/2015) 7 tablet 0   No facility-administered medications prior to visit.    History   Social History  . Marital Status: Married    Spouse Name: N/A  . Number of Children: N/A  . Years of Education: N/A   Social History Main Topics  . Smoking status: Current Every Day Smoker -- 1.00 packs/day for 30 years    Types: Cigarettes  . Smokeless tobacco: Not on file     Comment: not smoking every day  . Alcohol Use: No  . Drug Use: No  . Sexual Activity: No   Other Topics  Concern  . None   Social History Narrative     Review of Systems  Constitutional: Negative for fever, chills and appetite change.  HENT: Negative for congestion, ear pain, postnasal drip, sinus pressure and sore throat.   Eyes: Negative for pain and redness.  Respiratory: Negative for cough, shortness of breath and wheezing.   Cardiovascular: Negative for leg swelling.  Gastrointestinal: Negative for nausea, vomiting, abdominal pain, diarrhea, constipation and blood in stool.  Endocrine: Negative for polyuria.  Genitourinary: Negative for dysuria, urgency, frequency and flank pain.  Musculoskeletal: Negative for gait problem.  Skin: Negative for rash.  Neurological: Negative for weakness and headaches.  Psychiatric/Behavioral: Negative for confusion and decreased concentration. The patient is not nervous/anxious.     Objective:  BP 128/70 mmHg  Pulse 118  Temp(Src) 97.8 F (36.6 C) (Oral)  Resp 18  Ht 5\' 4"  (1.626 m)  Wt 136 lb 9.6 oz (61.961 kg)  BMI 23.44 kg/m2  SpO2 96%  Physical Exam  Constitutional: She is oriented to person, place, and time. She appears well-developed and well-nourished. No distress.  HENT:  Head: Normocephalic and atraumatic.  Right Ear: External ear normal.  Left Ear: External ear normal.  Nose: Nose normal.  Eyes: Conjunctivae and EOM are normal. Pupils are equal, round, and reactive to light. No scleral icterus.  Neck: Normal range of motion. Neck supple. No tracheal deviation present.  Cardiovascular:  Normal rate, regular rhythm and normal heart sounds.   Pulmonary/Chest: Effort normal. No respiratory distress. She has no wheezes. She has no rales.  Abdominal: She exhibits no mass. There is no tenderness. There is no rebound and no guarding.  Musculoskeletal: She exhibits no edema.  Lymphadenopathy:    She has no cervical adenopathy.  Neurological: She is alert and oriented to person, place, and time. Coordination normal.  Skin: Skin is  warm and dry. No rash noted.  Psychiatric: She has a normal mood and affect. Her behavior is normal.      Assessment & Plan:   Loretta Copeland was seen today for abdominal pain.  Diagnoses and all orders for this visit:  Cough Orders: -     DG Chest 2 View; Future  Acute bronchitis, unspecified organism  Other orders -     clarithromycin (BIAXIN) 500 MG tablet; Take 1 tablet (500 mg total) by mouth 2 (two) times daily. -     chlorpheniramine-HYDROcodone (TUSSIONEX PENNKINETIC ER) 10-8 MG/5ML SUER; Take 5 mLs by mouth 2 (two) times daily.   I am having Loretta Copeland start on clarithromycin and chlorpheniramine-HYDROcodone. I am also having her maintain her LORazepam, HYDROcodone-homatropine, and levofloxacin.  Meds ordered this encounter  Medications  . clarithromycin (BIAXIN) 500 MG tablet    Sig: Take 1 tablet (500 mg total) by mouth 2 (two) times daily.    Dispense:  20 tablet    Refill:  0  . chlorpheniramine-HYDROcodone (TUSSIONEX PENNKINETIC ER) 10-8 MG/5ML SUER    Sig: Take 5 mLs by mouth 2 (two) times daily.    Dispense:  60 mL    Refill:  0    Appropriate red flag conditions were discussed with the patient as well as actions that should be taken.  Patient expressed his understanding.  Follow-up: Return if symptoms worsen or fail to improve.  Carmelina Dane, MD   [facility] reading (PRIMARY) by  Dr. Dareen Piano.  Scarring versus atelectasis in both bases.Marland Kitchen

## 2015-07-25 NOTE — Telephone Encounter (Signed)
Left message for pt to call back. Her daughter's number [phone]

## 2015-07-27 ENCOUNTER — Other Ambulatory Visit: Payer: Self-pay

## 2015-07-27 DIAGNOSIS — R918 Other nonspecific abnormal finding of lung field: Secondary | ICD-10-CM

## 2015-07-27 NOTE — Telephone Encounter (Signed)
Spoke with pt's daughter. Ok to set up CT can. Please call daughter with the number below.

## 2015-08-02 ENCOUNTER — Ambulatory Visit (INDEPENDENT_AMBULATORY_CARE_PROVIDER_SITE_OTHER): Payer: BLUE CROSS/BLUE SHIELD

## 2015-08-02 ENCOUNTER — Ambulatory Visit (INDEPENDENT_AMBULATORY_CARE_PROVIDER_SITE_OTHER): Payer: BLUE CROSS/BLUE SHIELD | Admitting: Family Medicine

## 2015-08-02 VITALS — BP 118/74 | HR 95 | Temp 99.6°F | Resp 20 | Ht 64.0 in | Wt 137.1 lb

## 2015-08-02 DIAGNOSIS — R062 Wheezing: Secondary | ICD-10-CM | POA: Diagnosis not present

## 2015-08-02 DIAGNOSIS — J209 Acute bronchitis, unspecified: Secondary | ICD-10-CM | POA: Diagnosis not present

## 2015-08-02 DIAGNOSIS — R05 Cough: Secondary | ICD-10-CM | POA: Diagnosis not present

## 2015-08-02 DIAGNOSIS — R059 Cough, unspecified: Secondary | ICD-10-CM

## 2015-08-02 DIAGNOSIS — Z789 Other specified health status: Secondary | ICD-10-CM

## 2015-08-02 DIAGNOSIS — R918 Other nonspecific abnormal finding of lung field: Secondary | ICD-10-CM | POA: Diagnosis not present

## 2015-08-02 LAB — POCT CBC
Granulocyte percent: 68.7 %G (ref 37–80)
HCT, POC: 41.9 % (ref 37.7–47.9)
HEMOGLOBIN: 13.4 g/dL (ref 12.2–16.2)
LYMPH, POC: 1.9 (ref 0.6–3.4)
MCH, POC: 28.6 pg (ref 27–31.2)
MCHC: 32 g/dL (ref 31.8–35.4)
MCV: 89.4 fL (ref 80–97)
MID (cbc): 0.4 (ref 0–0.9)
MPV: 7.4 fL (ref 0–99.8)
POC Granulocyte: 5.1 (ref 2–6.9)
POC LYMPH PERCENT: 25.4 %L (ref 10–50)
POC MID %: 5.9 %M (ref 0–12)
Platelet Count, POC: 277 10*3/uL (ref 142–424)
RBC: 4.69 M/uL (ref 4.04–5.48)
RDW, POC: 14.6 %
WBC: 7.4 10*3/uL (ref 4.6–10.2)

## 2015-08-02 MED ORDER — ALBUTEROL SULFATE HFA 108 (90 BASE) MCG/ACT IN AERS: 1.0000 | INHALATION_SPRAY | Freq: Four times a day (QID) | RESPIRATORY_TRACT | Status: DC | PRN

## 2015-08-02 MED ORDER — ALBUTEROL SULFATE (2.5 MG/3ML) 0.083% IN NEBU
2.5000 mg | INHALATION_SOLUTION | Freq: Once | RESPIRATORY_TRACT | Status: AC
  Administered 2015-08-02: 2.5 mg via RESPIRATORY_TRACT

## 2015-08-02 MED ORDER — BENZONATATE 100 MG PO CAPS: 100.0000 mg | ORAL_CAPSULE | Freq: Three times a day (TID) | ORAL | Status: DC | PRN

## 2015-08-02 NOTE — Progress Notes (Addendum)
Subjective:  This chart was scribed for Loretta Staggers, MD by Andrew Au, ED Scribe. This patient was seen in room 1 and the patient's care was started at 6:29 PM.   Patient ID: Loretta Copeland, female    DOB: 1956-10-08, 59 y.o.   MRN: [id number]  HPI Chief Complaint  Patient presents with  . Cough    seen here on 8/6  pt is no better, still coughing with non productive cough.   HPI Comments: Loretta Copeland is a 59 y.o. female who presents to the [facility] complaining of a cough. She was last seen here 11 days ago. She had been having cough for 10 days at that time. Tachycardia  with rate 118 but afebrile. Treated with Biaxin for 10 days and was also prescribed tussionex suspension. Chest Xray report probable 4 cm right lower lung mass with scaring or atelectasis in left lung base. CT scan was ordered and scheduled for 2 days from now. She is a current smoker.    Pt states she had improving symptoms of cough and right side pain with medication but still has a slight cough. Pt feels as if she did not get enough medication. Pt now has a improved cough with less mucous than prior. She request longer coarse of abx. She reports subjective hot and cold flashes every day. Pt states she stopped smoking 7 days ago and had initially been smoking for 10 years. She is allergic to penicillins and tetracyclines. Question regarding cough and symptoms were asked multiple time with concerns for her understanding and for me to understand her concerns.    Pt reports she finished abx for acute bronchitis, diagnosed 11 days ago, but still has a slight dry cough along with left chest pain and new difficulty hearing out of right ear.  Pt states cough has improved since last visit but is wanting a refill of the medication. Pt states she discussed the XR that she had last visit with her daughter, but she was not told about the lung mass and thought lung mass was a little speck.  She denies hx of  asthma or COPD. She denies fever and chills.    There are no active problems to display for this patient.  Past Medical History  Diagnosis Date  . Migraines   . Arthritis   . Depression    History reviewed. No pertinent past surgical history. Allergies  Allergen Reactions  . Penicillins   . Tetracyclines & Related    Prior to Admission medications   Medication Sig Start Date End Date Taking? Authorizing Provider  chlorpheniramine-HYDROcodone (TUSSIONEX PENNKINETIC ER) 10-8 MG/5ML SUER Take 5 mLs by mouth 2 (two) times daily. 07/22/15  Yes Carmelina Dane, MD  LORazepam (ATIVAN) 2 MG tablet Take 1 tablet (2 mg total) by mouth every 6 (six) hours as needed for anxiety. 04/18/15  Yes [name], MD  clarithromycin (BIAXIN) 500 MG tablet Take 1 tablet (500 mg total) by mouth 2 (two) times daily. Patient not taking: Reported on 08/02/2015 07/22/15   Carmelina Dane, MD  HYDROcodone-homatropine [facility]) 5-1.5 MG/5ML syrup Take 5 mLs by mouth every 8 (eight) hours as needed for cough. Patient not taking: Reported on 07/22/2015 04/18/15   [name], MD  levofloxacin (LEVAQUIN) 500 MG tablet Take 1 tablet (500 mg total) by mouth daily. Patient not taking: Reported on 07/22/2015 04/18/15   [name], MD   Social History   Social History  . Marital Status:  Married    Spouse Name: N/A  . Number of Children: N/A  . Years of Education: N/A   Occupational History  . Not on file.   Social History Main Topics  . Smoking status: Current Every Day Smoker -- 1.00 packs/day for 30 years    Types: Cigarettes  . Smokeless tobacco: Not on file     Comment: not smoking every day  . Alcohol Use: No  . Drug Use: No  . Sexual Activity: No   Other Topics Concern  . Not on file   Social History Narrative   Review of Systems  Constitutional: Negative for fever and chills.  Respiratory: Positive for cough.    Objective:   Physical Exam  Constitutional: She is oriented to person,  place, and time. She appears well-developed and well-nourished. No distress.  HENT:  Head: Normocephalic and atraumatic.  Right Ear: Hearing, tympanic membrane, external ear and ear canal normal.  Left Ear: Hearing, tympanic membrane, external ear and ear canal normal.  Nose: Nose normal.  Mouth/Throat: Oropharynx is clear and moist. No oropharyngeal exudate.  Cerumen in left canal unable to visualize left TM. Right TM pearly gray  Eyes: Conjunctivae and EOM are normal. Pupils are equal, round, and reactive to light.  Neck: Neck supple.  Cardiovascular: Normal rate, regular rhythm, normal heart sounds and intact distal pulses.   No murmur heard. Pulmonary/Chest: Effort normal. No respiratory distress. She has wheezes ( faint, expiritory, ). She has no rhonchi.   Faint, expiratory wheeze with few coarse breath sounds lower lobes. Normal effort  Musculoskeletal: Normal range of motion.  Neurological: She is alert and oriented to person, place, and time.  Skin: Skin is warm and dry. No rash noted.  Psychiatric: She has a normal mood and affect. Her behavior is normal.  Nursing note and vitals reviewed.  Filed Vitals:   08/02/15 1824  BP: 118/74  Pulse: 95  Temp: 99.6 F (37.6 C)  TempSrc: Oral  Resp: 20  Height: 5\' 4"  (1.626 m)  Weight: 137 lb 2 oz (62.199 kg)  SpO2: 97%   Results for orders placed or performed in visit on 08/02/15  POCT CBC  Result Value Ref Range   WBC 7.4 4.6 - 10.2 K/uL   Lymph, poc 1.9 0.6 - 3.4   POC LYMPH PERCENT 25.4 10 - 50 %L   MID (cbc) 0.4 0 - 0.9   POC MID % 5.9 0 - 12 %M   POC Granulocyte 5.1 2 - 6.9   Granulocyte percent 68.7 37 - 80 %G   RBC 4.69 4.04 - 5.48 M/uL   Hemoglobin 13.4 12.2 - 16.2 g/dL   HCT, POC 16.1 09.6 - 47.9 %   MCV 89.4 80 - 97 fL   MCH, POC 28.6 27 - 31.2 pg   MCHC 32.0 31.8 - 35.4 g/dL   RDW, POC 04.5 %   Platelet Count, POC 277 142 - 424 K/uL   MPV 7.4 0 - 99.8 fL   Albuterol 2.5mg  neb given: decreased wheeze,  improved aeration.   [facility] reading (PRIMARY) by  Dr. Neva Seat:  CXR: right lower lobe there is a circular mass/increased marking since last visit. Slightly less prominent. No new lesions seen. No apparent infiltrate.         Assessment & Plan:   Loretta Copeland is a 59 y.o. female Acute bronchitis, unspecified organism, Cough , wheeze - Plan: POCT CBC, DG Chest 2 View, albuterol (PROVENTIL) (2.5 MG/3ML) 0.083% nebulizer solution 2.5  mg, albuterol (PROVENTIL HFA;VENTOLIN HFA) 108 (90 BASE) MCG/ACT inhaler  -suspect component of reactive airway, along with initial bronchitis.  She is completed appropriate course of antibiotics, afebrile, reassuring CBC, and no acute infiltrate noted on chest x-ray. This along with her improved cough, discussed did not feel it appropriate to start another antibiotic at this time. However as CT pending in 2 days, [place] to see further if secondary treatment needed, RTC precautions sooner if any worsening.  - Tessalon Perles if needed for cough, but if tight cough or wheezing, use albuterol inhaler. Use discussed. RTC precautions.  Lung mass  - with smoking history. Appears to be less prominent on chest x-ray in office, but CT pending in 2 days to further evaluate.  Language barrier   -An interpreter service was used. Pt interrupted interpreter multiple times as well as myself during initial eval to try to understand hx but ultimately clarified her concerns, understanding expressed with interpreter intial phone call was 41 minutes. Interpreter service used for discussion of plan, 14 minutes followed by 5 minutes of other discussion. Understanding expressed. Over 1 hour of face to face time.   Meds ordered this encounter  Medications  . albuterol (PROVENTIL) (2.5 MG/3ML) 0.083% nebulizer solution 2.5 mg    Sig:   . benzonatate (TESSALON) 100 MG capsule    Sig: Take 1 capsule (100 mg total) by mouth 3 (three) times daily as needed for cough.    Dispense:  20 capsule      Refill:  0  . albuterol (PROVENTIL HFA;VENTOLIN HFA) 108 (90 BASE) MCG/ACT inhaler    Sig: Inhale 1-2 puffs into the lungs every 6 (six) hours as needed for wheezing or shortness of breath.    Dispense:  1 Inhaler    Refill:  0   Patient Instructions  Your CT Scan is Friday, 08/04/2015 at 12:00pm at [facility]. Please arrive 15 minutes before CT scan.    [facility] [address] [place], [place] [address]  As we discussed in the visit tonight, I do not see a reason to give you another antibiotic at this time. You need to have your CT scan on Friday as we discussed. If there are concerns such as a pneumonia on the CT scan we can discuss antibiotics again, but your x-ray and blood counts tonight do not indicate this.  You were wheezing when you arrived tonight, which has improved with the breathing treatment. I have prescribed albuterol inhaler to use every 6 hours as needed for wheezing. You only need to take this if you are short of breath, coughing or wheezing.  You can take Tessalon 3 times per day as needed for cough, but as above if you are short of breath or wheezing use albuterol first.  Follow-up with our office on Saturday or Sunday to discuss the results of your CT scan. Dr. Dareen Piano who saw you last visit is here Sunday at 8:30.   Continue ibuprofen for the soreness on the chest wall from coughing. See more information on this condition below. If you are having worsening pain, return sooner than planned visit in 3 days, or to the emergency room.  Cough, Adult  A cough is a reflex that helps clear your throat and airways. It can help heal the body or may be a reaction to an irritated airway. A cough may only last 2 or 3 weeks (acute) or may last more than 8 weeks (chronic).  CAUSES Acute cough:  Viral or bacterial infections.  Chronic cough:  Infections.  Allergies.  Asthma.  Post-nasal drip.  Smoking.  Heartburn or acid reflux.  Some  medicines.  Chronic lung problems (COPD).  Cancer. SYMPTOMS   Cough.  Fever.  Chest pain.  Increased breathing rate.  High-pitched whistling sound when breathing (wheezing).  Colored mucus that you cough up (sputum). TREATMENT   A bacterial cough may be treated with antibiotic medicine.  A viral cough must run its course and will not respond to antibiotics.  Your caregiver may recommend other treatments if you have a chronic cough. HOME CARE INSTRUCTIONS   Only take over-the-counter or prescription medicines for pain, discomfort, or fever as directed by your caregiver. Use cough suppressants only as directed by your caregiver.  Use a cold steam vaporizer or humidifier in your bedroom or home to help loosen secretions.  Sleep in a semi-upright position if your cough is worse at night.  Rest as needed.  Stop smoking if you smoke. SEEK IMMEDIATE MEDICAL CARE IF:   You have pus in your sputum.  Your cough starts to worsen.  You cannot control your cough with suppressants and are losing sleep.  You begin coughing up blood.  You have difficulty breathing.  You develop pain which is getting worse or is uncontrolled with medicine.  You have a fever. MAKE SURE YOU:   Understand these instructions.  Will watch your condition.  Will get help right away if you are not doing well or get worse. Document Released: 05/31/2011 Document Revised: 02/24/2012 Document Reviewed: 05/31/2011 [facility] Patient Information 2015 [place], [place]. This information is not intended to replace advice given to you by your health care provider. Make sure you discuss any questions you have with your health care provider.  Bronchospasm A bronchospasm is when the tubes that carry air in and out of your lungs (airways) spasm or tighten. During a bronchospasm it is hard to breathe. This is because the airways get smaller. A bronchospasm can be triggered by:  Allergies. These may be to  animals, pollen, food, or mold.  Infection. This is a common cause of bronchospasm.  Exercise.  Irritants. These include pollution, cigarette smoke, strong odors, aerosol sprays, and paint fumes.  Weather changes.  Stress.  Being emotional. HOME CARE   Always have a plan for getting help. Know when to call your doctor and local emergency services (911 in the [place] Know where you can get emergency care.  Only take medicines as told by your doctor.  If you were prescribed an inhaler or nebulizer machine, ask your doctor how to use it correctly. Always use a spacer with your inhaler if you were given one.  Stay calm during an attack. Try to relax and breathe more slowly.  Control your home environment:  Change your heating and air conditioning filter at least once a month.  Limit your use of fireplaces and wood stoves.  Do not  smoke. Do not  allow smoking in your home.  Avoid perfumes and fragrances.  Get rid of pests (such as roaches and mice) and their droppings.  Throw away plants if you see mold on them.  Keep your house clean and dust free.  Replace carpet with wood, tile, or vinyl flooring. Carpet can trap dander and dust.  Use allergy-proof pillows, mattress covers, and box spring covers.  Wash bed sheets and blankets every week in hot water. Dry them in a dryer.  Use blankets that are made of polyester or cotton.  Wash hands frequently.  GET HELP IF:  You have muscle aches.  You have chest pain.  The thick spit you spit or cough up (sputum) changes from clear or white to yellow, green, gray, or bloody.  The thick spit you spit or cough up gets thicker.  There are problems that may be related to the medicine you are given such as:  A rash.  Itching.  Swelling.  Trouble breathing. GET HELP RIGHT AWAY IF:  You feel you cannot breathe or catch your breath.  You cannot stop coughing.  Your treatment is not helping you breathe better.  You  have very bad chest pain. MAKE SURE YOU:   Understand these instructions.  Will watch your condition.  Will get help right away if you are not doing well or get worse. Document Released: 09/29/2009 Document Revised: 12/07/2013 Document Reviewed: 05/25/2013 [facility] Patient Information 2015 [place], [place]. This information is not intended to replace advice given to you by your health care provider. Make sure you discuss any questions you have with your health care provider.  Chest Wall Pain Chest wall pain is pain in or around the bones and muscles of your chest. It may take up to 6 weeks to get better. It may take longer if you must stay physically active in your work and activities.  CAUSES  Chest wall pain may happen on its own. However, it may be caused by:  A viral illness like the flu.  Injury.  Coughing.  Exercise.  Arthritis.  Fibromyalgia.  Shingles. HOME CARE INSTRUCTIONS   Avoid overtiring physical activity. Try not to strain or perform activities that cause pain. This includes any activities using your chest or your abdominal and side muscles, especially if heavy weights are used.  Put ice on the sore area.  Put ice in a plastic bag.  Place a towel between your skin and the bag.  Leave the ice on for 15-20 minutes per hour while awake for the first 2 days.  Only take over-the-counter or prescription medicines for pain, discomfort, or fever as directed by your caregiver. SEEK IMMEDIATE MEDICAL CARE IF:   Your pain increases, or you are very uncomfortable.  You have a fever.  Your chest pain becomes worse.  You have new, unexplained symptoms.  You have nausea or vomiting.  You feel sweaty or lightheaded.  You have a cough with phlegm (sputum), or you cough up blood. MAKE SURE YOU:   Understand these instructions.  Will watch your condition.  Will get help right away if you are not doing well or get worse. Document Released: 12/02/2005 Document  Revised: 02/24/2012 Document Reviewed: 07/29/2011 [facility] Patient Information 2015 [place], [place]. This information is not intended to replace advice given to you by your health care provider. Make sure you discuss any questions you have with your health care provider.     I personally performed the services described in this documentation, which was scribed in my presence. The recorded information has been reviewed and considered, and addended by me as needed.

## 2015-08-02 NOTE — Patient Instructions (Signed)
Your CT Scan is Friday, 08/04/2015 at 12:00pm at [facility]. Please arrive 15 minutes before CT scan.    [facility] [address] [place], [place] [address]  As we discussed in the visit tonight, I do not see a reason to give you another antibiotic at this time. You need to have your CT scan on Friday as we discussed. If there are concerns such as a pneumonia on the CT scan we can discuss antibiotics again, but your x-ray and blood counts tonight do not indicate this.  You were wheezing when you arrived tonight, which has improved with the breathing treatment. I have prescribed albuterol inhaler to use every 6 hours as needed for wheezing. You only need to take this if you are short of breath, coughing or wheezing.  You can take Tessalon 3 times per day as needed for cough, but as above if you are short of breath or wheezing use albuterol first.  Follow-up with our office on Saturday or Sunday to discuss the results of your CT scan. Dr. Dareen Piano who saw you last visit is here Sunday at 8:30.   Continue ibuprofen for the soreness on the chest wall from coughing. See more information on this condition below. If you are having worsening pain, return sooner than planned visit in 3 days, or to the emergency room.  Cough, Adult  A cough is a reflex that helps clear your throat and airways. It can help heal the body or may be a reaction to an irritated airway. A cough may only last 2 or 3 weeks (acute) or may last more than 8 weeks (chronic).  CAUSES Acute cough:  Viral or bacterial infections. Chronic cough:  Infections.  Allergies.  Asthma.  Post-nasal drip.  Smoking.  Heartburn or acid reflux.  Some medicines.  Chronic lung problems (COPD).  Cancer. SYMPTOMS   Cough.  Fever.  Chest pain.  Increased breathing rate.  High-pitched whistling sound when breathing (wheezing).  Colored mucus that you cough up (sputum). TREATMENT   A bacterial cough may be  treated with antibiotic medicine.  A viral cough must run its course and will not respond to antibiotics.  Your caregiver may recommend other treatments if you have a chronic cough. HOME CARE INSTRUCTIONS   Only take over-the-counter or prescription medicines for pain, discomfort, or fever as directed by your caregiver. Use cough suppressants only as directed by your caregiver.  Use a cold steam vaporizer or humidifier in your bedroom or home to help loosen secretions.  Sleep in a semi-upright position if your cough is worse at night.  Rest as needed.  Stop smoking if you smoke. SEEK IMMEDIATE MEDICAL CARE IF:   You have pus in your sputum.  Your cough starts to worsen.  You cannot control your cough with suppressants and are losing sleep.  You begin coughing up blood.  You have difficulty breathing.  You develop pain which is getting worse or is uncontrolled with medicine.  You have a fever. MAKE SURE YOU:   Understand these instructions.  Will watch your condition.  Will get help right away if you are not doing well or get worse. Document Released: 05/31/2011 Document Revised: 02/24/2012 Document Reviewed: 05/31/2011 [facility] Patient Information 2015 [place], [place]. This information is not intended to replace advice given to you by your health care provider. Make sure you discuss any questions you have with your health care provider.  Bronchospasm A bronchospasm is when the tubes that carry air in and out  of your lungs (airways) spasm or tighten. During a bronchospasm it is hard to breathe. This is because the airways get smaller. A bronchospasm can be triggered by:  Allergies. These may be to animals, pollen, food, or mold.  Infection. This is a common cause of bronchospasm.  Exercise.  Irritants. These include pollution, cigarette smoke, strong odors, aerosol sprays, and paint fumes.  Weather changes.  Stress.  Being emotional. HOME CARE   Always have a  plan for getting help. Know when to call your doctor and local emergency services (911 in the [place] Know where you can get emergency care.  Only take medicines as told by your doctor.  If you were prescribed an inhaler or nebulizer machine, ask your doctor how to use it correctly. Always use a spacer with your inhaler if you were given one.  Stay calm during an attack. Try to relax and breathe more slowly.  Control your home environment:  Change your heating and air conditioning filter at least once a month.  Limit your use of fireplaces and wood stoves.  Do not  smoke. Do not  allow smoking in your home.  Avoid perfumes and fragrances.  Get rid of pests (such as roaches and mice) and their droppings.  Throw away plants if you see mold on them.  Keep your house clean and dust free.  Replace carpet with wood, tile, or vinyl flooring. Carpet can trap dander and dust.  Use allergy-proof pillows, mattress covers, and box spring covers.  Wash bed sheets and blankets every week in hot water. Dry them in a dryer.  Use blankets that are made of polyester or cotton.  Wash hands frequently. GET HELP IF:  You have muscle aches.  You have chest pain.  The thick spit you spit or cough up (sputum) changes from clear or white to yellow, green, gray, or bloody.  The thick spit you spit or cough up gets thicker.  There are problems that may be related to the medicine you are given such as:  A rash.  Itching.  Swelling.  Trouble breathing. GET HELP RIGHT AWAY IF:  You feel you cannot breathe or catch your breath.  You cannot stop coughing.  Your treatment is not helping you breathe better.  You have very bad chest pain. MAKE SURE YOU:   Understand these instructions.  Will watch your condition.  Will get help right away if you are not doing well or get worse. Document Released: 09/29/2009 Document Revised: 12/07/2013 Document Reviewed: 05/25/2013 [facility] Patient  Information 2015 [place], [place]. This information is not intended to replace advice given to you by your health care provider. Make sure you discuss any questions you have with your health care provider.  Chest Wall Pain Chest wall pain is pain in or around the bones and muscles of your chest. It may take up to 6 weeks to get better. It may take longer if you must stay physically active in your work and activities.  CAUSES  Chest wall pain may happen on its own. However, it may be caused by:  A viral illness like the flu.  Injury.  Coughing.  Exercise.  Arthritis.  Fibromyalgia.  Shingles. HOME CARE INSTRUCTIONS   Avoid overtiring physical activity. Try not to strain or perform activities that cause pain. This includes any activities using your chest or your abdominal and side muscles, especially if heavy weights are used.  Put ice on the sore area.  Put ice in a plastic bag.  Place a towel between your skin and the bag.  Leave the ice on for 15-20 minutes per hour while awake for the first 2 days.  Only take over-the-counter or prescription medicines for pain, discomfort, or fever as directed by your caregiver. SEEK IMMEDIATE MEDICAL CARE IF:   Your pain increases, or you are very uncomfortable.  You have a fever.  Your chest pain becomes worse.  You have new, unexplained symptoms.  You have nausea or vomiting.  You feel sweaty or lightheaded.  You have a cough with phlegm (sputum), or you cough up blood. MAKE SURE YOU:   Understand these instructions.  Will watch your condition.  Will get help right away if you are not doing well or get worse. Document Released: 12/02/2005 Document Revised: 02/24/2012 Document Reviewed: 07/29/2011 [facility] Patient Information 2015 [place], [place]. This information is not intended to replace advice given to you by your health care provider. Make sure you discuss any questions you have with your health care provider.

## 2015-08-04 ENCOUNTER — Inpatient Hospital Stay: Admission: RE | Admit: 2015-08-04 | Payer: Self-pay | Source: Ambulatory Visit

## 2015-08-04 ENCOUNTER — Telehealth: Payer: Self-pay | Admitting: *Deleted

## 2015-08-04 NOTE — Telephone Encounter (Signed)
[facility] called and stated that pt no showed for her CT scan.  LMOM to see why she did not show and see about rescheduling for her.

## 2015-08-08 NOTE — Telephone Encounter (Signed)
Spoke to daughter. Daughter is going to talk to pt and let us know if pt wants to re-schedule. Pt said she already had "two scans done" and did not want more. Pt has not had previous CT. She had two CXR's done, both which showed a lung nodule. Doing CT scan to follow up on lung nodule.

## 2015-08-09 ENCOUNTER — Encounter: Payer: Self-pay | Admitting: Family Medicine

## 2015-08-09 ENCOUNTER — Ambulatory Visit (INDEPENDENT_AMBULATORY_CARE_PROVIDER_SITE_OTHER): Payer: BLUE CROSS/BLUE SHIELD | Admitting: Family Medicine

## 2015-08-09 VITALS — BP 118/88 | HR 96 | Temp 98.3°F | Resp 18 | Wt 136.4 lb

## 2015-08-09 DIAGNOSIS — J209 Acute bronchitis, unspecified: Secondary | ICD-10-CM | POA: Diagnosis not present

## 2015-08-09 MED ORDER — PREDNISONE 20 MG PO TABS: ORAL_TABLET | ORAL | Status: DC

## 2015-08-09 MED ORDER — AZITHROMYCIN 250 MG PO TABS: ORAL_TABLET | ORAL | Status: DC

## 2015-08-09 NOTE — Patient Instructions (Signed)
?????? ??????? (  Acute Bronchitis) ??????? - ??? ?????????? ??????????? ?????, ??????? ???? ?? ?????? ? ?????? (??????). ????? ?????????? ????? ?????????????? ???????????? ?????. ??? ???????? ? ????????? ?????, ??????? ???????? ???????? ???????????????? ????????? ????????.  ?????? ??????? ?????? ??????????? ?????? ? ???????? ? ???????? ???????, ??? ???????, ????? ???? ??????. ???????, ???????? ? ???????????? ????? ????? ????????? ??????? ????????. ????????? ?????? ???????? ????? ??????? ????????? ?? ??????? ??????.  ??????? ? ??????????? ??????? ??????? ?????????? ??? ?? ???????? ?????????, ??????? ???????? ???????? ??????? ????????. ???? ????? ????? ???????????? ?? ???????? ???????? ??? ????? ??? ???????, ? ????? ??? ???????? ? ??????????????? ?????????? (???????? ???????????). ???????? ? ????????   ??????.  ????????? ??????????? ????.  ???????????? ?????.  ???? ?? ????? ????.  ???????? ???????????? ? ?????.  ?????.  ??????.  ???? ? ?????. ???????  ?????? ??????? ?????? ????????????? ??? ??????????? ????????????. ??? ??????? ???? ????? ?????? ??? ??????? ?? ??????? ???????. ????? ????????? ?????? ?????????, ?????? ???????? ?????????????? ????????????, ????? ??? ?????????????? ??????? ??????? ??????.  ???????  ?????? ??????? ?????? ???????? ????? ???? ??????. ????? ???????? ???????????????? ??????? ?? ?????????. ????????? ?????? ????????? ??? ?????????? ????????? ??? ?????. ???????????, ??? ???????, ????????? ?? ?????????, ?? ? ???????????? ????????? ?? ????? ?????????. ? ????????? ???????, ????? ????????? ?????? ? ?????????????? ??????, ????? ???? ????????????? ????????????? ??????????. ?????????? ?????????? ? ???????? ??????????? ????? ?????????????? ?????????? ??????? ? ???????? ???????.  ?????????? ?? ????? ? ???????? ????????  ????? ?????????.  ????? ?????? ?????????, ????? ???? ???? ?????????? ? ?????????? ??? ??????-??????? ????? (???? ?????? ? ??? ??? ???????????, ??? ???????  ????????? ??????????? ??????????? ????????). ???????? ????? ???????????? ?????????? ??????? ??????????? ????? (???????), ????????? ???????????? ????? ? ????????? ???????? ?????????????.  ?????????? ????????????? ????????? ?????? ? ???????????? ? ?????????? ?????.  ?? ?????? ????????? ??????????? ???? ????????????, ???? ???? ??? ?????????? ???? ?????.  ?? ?????? ? ?? ?????????? ????? ? ???????? ??????. ??????????? ??????????? ???? ? ???????????? ?????????? ?????????? ???????? ??????? ???????. ???? ?? ?????? ????????, ?????????? ??????? ?? ??????????? ????? (??????????? ??????? ? ???????) ??? ??????????? ????????, ????? ????????? ????? ?? ???????. ????? ?? ??????? ??????? ????????????? ??????.  ????? ????? ????, ????????? ????????? ? ?????? ? ?????????? ???????? ? ?????????? ?? ??????? ?????? ???, ??? ? ????? - ??? ??? ??????? ??????????? ???????? ?????????? ??????? ??????? ????????.  ????????? ? ????? ?? ??? ??????????? ??????. ?????????? ? ?????, ????: ????? 1 ?????? ??????? ???????? ??????????? ?? ????????.  ?????????? ?????????? ? ?????, ????:  ? ??? ????????? ????????? ??? ?????  ? ??? ????????? ???? ? ?????;  ? ??? ????????? ??????? ??????;  ? ??? ????????? ???????? ???????;  ? ??? ????????? ???????? ?????????????;  ? ??? ??? ??????? ??? ?? ?? ??? ??????????? ?????????????? ?????????;  ? ??? ????????? ????????? ?????;  ? ??? ???????? ??????? ???????? ????. ?????????, ??? ??:   ????????? ?????? ??????????.  ?????? ?????????????? ??????? ?? ????? ??????????.  ??????????????? ?????????? ? ?????, ???? ??? ?? ?????????? ????? ??? ?????????? ????. Document Released: 09/29/2009 Document Revised: 04/18/2014 [facility] Patient Information 2015 [place], [place]. This information is not intended to replace advice given to you by your health care provider. Make sure you discuss any questions you have with your health care provider.

## 2015-08-09 NOTE — Progress Notes (Signed)
° °  Subjective:    Patient ID: Loretta Copeland, female    DOB: [birth year], 59 y.o.   MRN: [id number] This chart was scribed for [name], MD by Jolene Provost, Medical Scribe. This patient was seen in Room 2 and the patient's care was started a 7:04 PM.  Chief Complaint  Patient presents with   Follow-up    for lung issues    HPI  Is a 59 year old woman [place] with a persistent cough. She has not smoked in the last 11 days but the cough is persisting. She saw Dr. Dareen Piano couple weeks ago who prescribed some cough syrup but this hasn't helped. She's had no hemoptysis or shortness of breath. He denies chest pain other than left sided rib pain. This is worse when she takes a deep breath.  She's not sleeping well because of the cough.   Review of Systems  Constitutional: Negative for fever and chills.  Respiratory: Positive for cough. Negative for shortness of breath.    positive for left-sided chest pain.     Objective:   Physical Exam  Constitutional: She is oriented to person, place, and time. She appears well-developed and well-nourished. No distress.  HENT:  Head: Normocephalic and atraumatic.  Eyes: Pupils are equal, round, and reactive to light.  Neck: Neck supple.  Cardiovascular: Normal rate.   Pulmonary/Chest: Effort normal. No respiratory distress.  Expiratory wheezes and bibasilar rales.  Musculoskeletal: Normal range of motion.  Neurological: She is alert and oriented to person, place, and time. Coordination normal.  Skin: Skin is warm and dry. She is not diaphoretic.  Psychiatric: She has a normal mood and affect. Her behavior is normal.  Nursing note and vitals reviewed.  Left TM shows resolving hemotympanum. Filed Vitals:   08/09/15 1849  BP: 118/88  Pulse: 96  Temp: 98.3 F (36.8 C)  TempSrc: Oral  Resp: 18  Weight: 136 lb 6.4 oz (61.871 kg)  SpO2: 96%       Assessment & Plan:   This chart was scribed in my presence and reviewed by me  personally.    ICD-9-CM ICD-10-CM   1. Acute bronchitis, unspecified organism 466.0 J20.9 azithromycin (ZITHROMAX) 250 MG tablet     predniSONE (DELTASONE) 20 MG tablet     Signed, [name], MD

## 2015-08-17 ENCOUNTER — Ambulatory Visit (INDEPENDENT_AMBULATORY_CARE_PROVIDER_SITE_OTHER): Payer: BLUE CROSS/BLUE SHIELD | Admitting: Family Medicine

## 2015-08-17 VITALS — BP 118/80 | HR 103 | Temp 98.3°F | Resp 16 | Ht 64.0 in | Wt 137.6 lb

## 2015-08-17 DIAGNOSIS — H6505 Acute serous otitis media, recurrent, left ear: Secondary | ICD-10-CM | POA: Diagnosis not present

## 2015-08-17 DIAGNOSIS — J410 Simple chronic bronchitis: Secondary | ICD-10-CM

## 2015-08-17 MED ORDER — HYDROCOD POLST-CPM POLST ER 10-8 MG/5ML PO SUER: 5.0000 mL | Freq: Two times a day (BID) | ORAL | Status: DC

## 2015-08-17 MED ORDER — LEVOFLOXACIN 500 MG PO TABS: 500.0000 mg | ORAL_TABLET | Freq: Every day | ORAL | Status: DC

## 2015-08-17 NOTE — Patient Instructions (Signed)
Let me know if you are not improving by Monday

## 2015-08-17 NOTE — Progress Notes (Signed)
This chart was scribed for Loretta Sidle, MD by Watt Climes Rifaie medical scribe at [facility].The patient was seen in exam room 8 and the patient's care was started at 7:17 PM.  Patient ID: Loretta Copeland MRN: [id number], DOB: 17-Jan-1956, 59 y.o. Date of Encounter: 08/17/2015  Primary Physician: No PCP Per Patient  Chief Complaint:  Chief Complaint  Patient presents with  . Sore Throat    x 2 days  . Ear Pain    left, x 1 day  . Cough  . Abdominal Pain    left side    HPI:  Loretta Copeland is a 59 y.o. female who presents to [facility] complaining of left ear ache, onset yesterday.  Pt does not recall whether she has used a q-tip recently. Pt also reports symptoms of sore throat for 2 days, cough however mild today, and unsteady gait. Pt indicates that she has recently quit smoking.   Pt notes that she has a left-sided abdominal pain. She does not recall when she last had X-rays done. Pt was not compliant with taking prednisone prescribed by Dr. Neva Seat.    Past Medical History  Diagnosis Date  . Migraines   . Arthritis   . Depression      Home Meds: Prior to Admission medications   Medication Sig Start Date End Date Taking? Authorizing Provider  albuterol (PROVENTIL HFA;VENTOLIN HFA) 108 (90 BASE) MCG/ACT inhaler Inhale 1-2 puffs into the lungs every 6 (six) hours as needed for wheezing or shortness of breath. 08/02/15   Shade Flood, MD  azithromycin (ZITHROMAX) 250 MG tablet Take 2 tabs PO x 1 dose, then 1 tab PO QD x 4 days 08/09/15   Loretta Sidle, MD  chlorpheniramine-HYDROcodone [facility]) 10-8 MG/5ML SUER Take 5 mLs by mouth 2 (two) times daily. 07/22/15   Carmelina Dane, MD  LORazepam (ATIVAN) 2 MG tablet Take 1 tablet (2 mg total) by mouth every 6 (six) hours as needed for anxiety. 04/18/15   Loretta Sidle, MD  predniSONE (DELTASONE) 20 MG tablet Take 3 PO QAM x3days, 2 PO QAM x3days, 1 PO QAM x3days 08/09/15    Loretta Sidle, MD    Allergies:  Allergies  Allergen Reactions  . Penicillins   . Tetracyclines & Related     Social History   Social History  . Marital Status: Married    Spouse Name: N/A  . Number of Children: N/A  . Years of Education: N/A   Occupational History  . Not on file.   Social History Main Topics  . Smoking status: Current Every Day Smoker -- 1.00 packs/day for 30 years    Types: Cigarettes  . Smokeless tobacco: Not on file     Comment: not smoking every day  . Alcohol Use: No  . Drug Use: No  . Sexual Activity: No   Other Topics Concern  . Not on file   Social History Narrative     Review of Systems: Constitutional: negative for chills, fever, night sweats, weight changes, or fatigue  HEENT: positive for sore throat, ear pain. Negative for vision changes, hearing loss, congestion, rhinorrhea, ST, epistaxis, or sinus pressure Cardiovascular: negative for chest pain or palpitations Respiratory: Positive for cough. Negative for hemoptysis, wheezing, shortness of breath. Abdominal: Positive for abdominal pain. Negative for nausea, vomiting, diarrhea, or constipation Dermatological: negative for rash Neurologic: negative for headache, dizziness, or syncope Positive for unsteady gait.  All other systems reviewed  and are otherwise negative with the exception to those above and in the HPI.  Physical Exam: Blood pressure 118/80, pulse 103, temperature 98.3 F (36.8 C), temperature source Oral, resp. rate 16, height 5\' 4"  (1.626 m), weight 137 lb 9.6 oz (62.415 kg), SpO2 98 %., Body mass index is 23.61 kg/(m^2). General: Well developed, well nourished, in no acute distress. Head: Normocephalic, atraumatic, eyes without discharge, sclera non-icteric, nares are without discharge. Bilateral auditory canals clear, TM's are without perforation, pearly grey and translucent with reflective cone of light on right, blood-tinged left TM. Oral cavity moist, posterior  pharynx without exudate, erythema, peritonsillar abscess, or post nasal drip.  Neck: Supple. No thyromegaly. Full ROM. No lymphadenopathy. Lungs: Clear bilaterally to auscultation without wheezes, rales, or rhonchi. Breathing is unlabored. Heart: RRR with S1 S2. No murmurs, rubs, or gallops appreciated. Abdomen: Soft, non-tender, non-distended with normoactive bowel sounds. No hepatomegaly. No rebound/guarding. No obvious abdominal masses. Msk:  Strength and tone normal for age. Extremities/Skin: Warm and dry. No clubbing or cyanosis. No edema. No rashes or suspicious lesions. Neuro: Alert and oriented X 3. Moves all extremities spontaneously. Gait is normal. CNII-XII grossly in tact. Psych:  Responds to questions appropriately with a normal affect.    ASSESSMENT AND PLAN:  59 y.o. year old female with chronic bronchitis and recurrent left ear infections    This chart was scribed in my presence and reviewed by me personally.    ICD-9-CM ICD-10-CM   1. Simple chronic bronchitis 491.0 J41.0 levofloxacin (LEVAQUIN) 500 MG tablet     chlorpheniramine-HYDROcodone (TUSSIONEX PENNKINETIC ER) 10-8 MG/5ML SUER  2. Recurrent acute serous otitis media of left ear 381.01 H65.05 levofloxacin (LEVAQUIN) 500 MG tablet    Patient instructed to call if not better by Monday  Signed, Loretta Sidle, MD 08/17/2015 7:17 PM

## 2015-08-18 ENCOUNTER — Telehealth: Payer: Self-pay

## 2015-09-06 ENCOUNTER — Other Ambulatory Visit: Payer: Self-pay | Admitting: Family Medicine

## 2015-09-06 DIAGNOSIS — R918 Other nonspecific abnormal finding of lung field: Secondary | ICD-10-CM

## 2015-09-11 ENCOUNTER — Inpatient Hospital Stay: Admission: RE | Admit: 2015-09-11 | Payer: Self-pay | Source: Ambulatory Visit

## 2015-09-20 ENCOUNTER — Inpatient Hospital Stay: Admission: RE | Admit: 2015-09-20 | Payer: Self-pay | Source: Ambulatory Visit

## 2015-11-06 ENCOUNTER — Ambulatory Visit (INDEPENDENT_AMBULATORY_CARE_PROVIDER_SITE_OTHER): Payer: BLUE CROSS/BLUE SHIELD | Admitting: Family Medicine

## 2015-11-06 ENCOUNTER — Encounter: Payer: Self-pay | Admitting: Family Medicine

## 2015-11-06 VITALS — BP 134/78 | HR 95 | Temp 97.8°F | Resp 18 | Ht 64.0 in | Wt 141.0 lb

## 2015-11-06 DIAGNOSIS — H6505 Acute serous otitis media, recurrent, left ear: Secondary | ICD-10-CM | POA: Diagnosis not present

## 2015-11-06 DIAGNOSIS — J410 Simple chronic bronchitis: Secondary | ICD-10-CM | POA: Diagnosis not present

## 2015-11-06 DIAGNOSIS — Z720 Tobacco use: Secondary | ICD-10-CM

## 2015-11-06 DIAGNOSIS — F419 Anxiety disorder, unspecified: Secondary | ICD-10-CM

## 2015-11-06 DIAGNOSIS — R062 Wheezing: Secondary | ICD-10-CM

## 2015-11-06 DIAGNOSIS — R05 Cough: Secondary | ICD-10-CM

## 2015-11-06 DIAGNOSIS — R059 Cough, unspecified: Secondary | ICD-10-CM

## 2015-11-06 DIAGNOSIS — F17200 Nicotine dependence, unspecified, uncomplicated: Secondary | ICD-10-CM | POA: Insufficient documentation

## 2015-11-06 MED ORDER — LEVOFLOXACIN 500 MG PO TABS: 500.0000 mg | ORAL_TABLET | Freq: Every day | ORAL | Status: DC

## 2015-11-06 MED ORDER — ALBUTEROL SULFATE HFA 108 (90 BASE) MCG/ACT IN AERS: 1.0000 | INHALATION_SPRAY | Freq: Four times a day (QID) | RESPIRATORY_TRACT | Status: DC | PRN

## 2015-11-06 MED ORDER — LORAZEPAM 2 MG PO TABS: 2.0000 mg | ORAL_TABLET | Freq: Four times a day (QID) | ORAL | Status: DC | PRN

## 2015-11-06 NOTE — Progress Notes (Signed)
This chart was scribed for [name], MD by Stann Ore, medical scribe at [facility].The patient was seen in exam room 3 and the patient's care was started at 1:47 PM.  Patient ID: Loretta Copeland MRN: [id number], DOB: 05-Oct-1956, 59 y.o. Date of Encounter: 11/06/2015  Primary Physician: No PCP Per Patient  Chief Complaint:  Chief Complaint  Patient presents with   Ear Pain    x4 days left   Medication Refill    ativan    HPI:  Loretta Copeland is a 59 y.o. female who presents to [facility] complaining of left ear pain for past 4 days.  Left Ear Pain She's noticed left ear pain for 4 days now. She has some pain in her right ear too. She has some tinnitus too. She also notes having some pain in her nasal passages. The wind also hurts her ears as well.   Smoking She is still smoking. When she doesn't eat, she gets antsy and can't sit still. She goes outside to smoke.   Medication She also wants medication refill of her ativan.   Past Medical History  Diagnosis Date   Migraines    Arthritis    Depression      Home Meds: Prior to Admission medications   Medication Sig Start Date End Date Taking? Authorizing Provider  albuterol (PROVENTIL HFA;VENTOLIN HFA) 108 (90 BASE) MCG/ACT inhaler Inhale 1-2 puffs into the lungs every 6 (six) hours as needed for wheezing or shortness of breath. 08/02/15   Shade Flood, MD  chlorpheniramine-HYDROcodone [facility] PENNKINETIC ER) 10-8 MG/5ML SUER Take 5 mLs by mouth 2 (two) times daily. 08/17/15   [name], MD  levofloxacin (LEVAQUIN) 500 MG tablet Take 1 tablet (500 mg total) by mouth daily. 08/17/15   [name], MD  LORazepam (ATIVAN) 2 MG tablet Take 1 tablet (2 mg total) by mouth every 6 (six) hours as needed for anxiety. 04/18/15   [name], MD    Allergies:  Allergies  Allergen Reactions   Penicillins    Tetracyclines & Related     Social History   Social  History   Marital Status: Married    Spouse Name: N/A   Number of Children: N/A   Years of Education: N/A   Occupational History   Not on file.   Social History Main Topics   Smoking status: Current Some Day Smoker -- 1.00 packs/day for 30 years    Types: Cigarettes   Smokeless tobacco: Not on file     Comment: not smoking every day   Alcohol Use: No   Drug Use: No   Sexual Activity: No   Other Topics Concern   Not on file   Social History Narrative     Review of Systems:  Constitutional: negative for fever, chills, night sweats, weight changes, or fatigue  HEENT: negative for vision changes, congestion, rhinorrhea, ST, epistaxis; positive for sinus pressure, tinnitus, ear pain (bilaterally)  Cardiovascular: negative for chest pain or palpitations Respiratory: negative for hemoptysis, wheezing, shortness of breath, or cough Abdominal: negative for abdominal pain, nausea, vomiting, diarrhea, or constipation Dermatological: negative for rash Neurologic: negative for headache, dizziness, or syncope All other systems reviewed and are otherwise negative with the exception to those above and in the HPI.  Physical Exam: Blood pressure 134/78, pulse 95, temperature 97.8 F (36.6 C), temperature source Oral, resp. rate 18, height  (1.626 m), weight 141 lb (63.957 kg), SpO2 96 %.,  Body mass index is 24.19 kg/(m^2). General: Well developed, well nourished, in no acute distress. Head: Normocephalic, atraumatic, eyes without discharge, sclera non-icteric, Oral cavity moist, posterior pharynx without exudate, erythema, peritonsillar abscess, or post nasal drip.  Neck: Supple. No thyromegaly. Full ROM. No lymphadenopathy. Lungs: Clear bilaterally to auscultation without wheezes, rales, or rhonchi. Breathing is unlabored. Heart: RRR with S1 S2. No murmurs, rubs, or gallops appreciated. Msk:  Strength and tone normal for age. Extremities/Skin: Warm and dry. No clubbing or  cyanosis. No edema. No rashes or suspicious lesions. Neuro: Alert and oriented X 3. Moves all extremities spontaneously. Gait is normal. CNII-XII grossly in tact. Psych:  Responds to questions appropriately with a normal affect.    ASSESSMENT AND PLAN:  59 y.o. year old female with  This chart was scribed in my presence and reviewed by me personally.    ICD-9-CM ICD-10-CM   1. Anxiety 300.00 F41.9 LORazepam (ATIVAN) 2 MG tablet  2. Cough 786.2 R05 albuterol (PROVENTIL HFA;VENTOLIN HFA) 108 (90 BASE) MCG/ACT inhaler  3. Wheeze 786.07 R06.2 albuterol (PROVENTIL HFA;VENTOLIN HFA) 108 (90 BASE) MCG/ACT inhaler  4. Simple chronic bronchitis (HCC) 491.0 J41.0 levofloxacin (LEVAQUIN) 500 MG tablet  5. Recurrent acute serous otitis media of left ear 381.01 H65.05 levofloxacin (LEVAQUIN) 500 MG tablet      By signing my name below, I, Stann Oresung-Kai Tsai, attest that this documentation has been prepared under the direction and in the presence of Elvina SidleKurt Lauenstein, MD. Electronically Signed: Stann Oresung-Kai Tsai, Scribe. 11/06/2015 , 1:47 PM .  Signed, Elvina SidleKurt Lauenstein, MD 11/06/2015 1:47 PM

## 2015-12-05 ENCOUNTER — Ambulatory Visit (INDEPENDENT_AMBULATORY_CARE_PROVIDER_SITE_OTHER): Payer: BLUE CROSS/BLUE SHIELD | Admitting: Physician Assistant

## 2015-12-05 ENCOUNTER — Ambulatory Visit: Payer: BLUE CROSS/BLUE SHIELD

## 2015-12-05 VITALS — BP 112/64 | HR 97 | Temp 98.3°F | Resp 16 | Ht 64.5 in | Wt 145.6 lb

## 2015-12-05 DIAGNOSIS — R51 Headache: Secondary | ICD-10-CM | POA: Diagnosis not present

## 2015-12-05 DIAGNOSIS — R519 Headache, unspecified: Secondary | ICD-10-CM

## 2015-12-05 DIAGNOSIS — R062 Wheezing: Secondary | ICD-10-CM | POA: Diagnosis not present

## 2015-12-05 DIAGNOSIS — F17210 Nicotine dependence, cigarettes, uncomplicated: Secondary | ICD-10-CM | POA: Diagnosis not present

## 2015-12-05 DIAGNOSIS — R0981 Nasal congestion: Secondary | ICD-10-CM | POA: Diagnosis not present

## 2015-12-05 DIAGNOSIS — R05 Cough: Secondary | ICD-10-CM

## 2015-12-05 DIAGNOSIS — R059 Cough, unspecified: Secondary | ICD-10-CM

## 2015-12-05 LAB — POCT CBC
Granulocyte percent: 67.6 %G (ref 37–80)
HEMATOCRIT: 42.2 % (ref 37.7–47.9)
HEMOGLOBIN: 14.3 g/dL (ref 12.2–16.2)
LYMPH, POC: 1.4 (ref 0.6–3.4)
MCH, POC: 29.2 pg (ref 27–31.2)
MCHC: 34 g/dL (ref 31.8–35.4)
MCV: 86 fL (ref 80–97)
MID (CBC): 0.4 (ref 0–0.9)
MPV: 7.5 fL (ref 0–99.8)
PLATELET COUNT, POC: 178 10*3/uL (ref 142–424)
POC GRANULOCYTE: 3.8 (ref 2–6.9)
POC LYMPH %: 25.6 % (ref 10–50)
POC MID %: 6.8 % (ref 0–12)
RBC: 4.91 M/uL (ref 4.04–5.48)
RDW, POC: 14.2 %
WBC: 5.6 10*3/uL (ref 4.6–10.2)

## 2015-12-05 MED ORDER — HYDROCODONE-HOMATROPINE 5-1.5 MG/5ML PO SYRP: 2.5000 mL | ORAL_SOLUTION | Freq: Every evening | ORAL | Status: DC | PRN

## 2015-12-05 MED ORDER — CETIRIZINE-PSEUDOEPHEDRINE ER 5-120 MG PO TB12: 1.0000 | ORAL_TABLET | ORAL | Status: DC

## 2015-12-05 MED ORDER — IBUPROFEN 800 MG PO TABS: 400.0000 mg | ORAL_TABLET | Freq: Three times a day (TID) | ORAL | Status: AC

## 2015-12-05 MED ORDER — ALBUTEROL SULFATE (2.5 MG/3ML) 0.083% IN NEBU
2.5000 mg | INHALATION_SOLUTION | Freq: Once | RESPIRATORY_TRACT | Status: AC
  Administered 2015-12-05: 2.5 mg via RESPIRATORY_TRACT

## 2015-12-05 MED ORDER — NICOTINE 14 MG/24HR TD PT24: 14.0000 mg | MEDICATED_PATCH | Freq: Every day | TRANSDERMAL | Status: DC

## 2015-12-05 MED ORDER — IPRATROPIUM BROMIDE 0.02 % IN SOLN
0.5000 mg | Freq: Once | RESPIRATORY_TRACT | Status: AC
  Administered 2015-12-05: 0.5 mg via RESPIRATORY_TRACT

## 2015-12-05 NOTE — Progress Notes (Signed)
12/05/2015 6:45 PM   DOB: [birth year] / MRN: [id number]  SUBJECTIVE:  Loretta Copeland is a 59 y.o. female presenting for a sinus problem.  She was last seen by Dr. Milus Copeland and was placed on Levaquin on 9/01 for recurrent serous otitis media of the left ear and simple chronic bronchitis. Today she complains of sinus congestion and right sided headache that she describes as pressure and non pulsatile.  No vision changes.  She has not taken any medication.  She is a current smoker with a 5 pack year history. She would like to quit and is interested in patches.      She is allergic to penicillins and tetracyclines & related.   She  has a past medical history of Migraines; Arthritis; and Depression.    She  reports that she has been smoking Cigarettes.  She has a 30 pack-year smoking history. She does not have any smokeless tobacco history on file. She reports that she does not drink alcohol or use illicit drugs. She  reports that she does not engage in sexual activity. The patient  has no past surgical history on file.  Her family history includes Diabetes in her brother, daughter, father, and mother.  Review of Systems  Constitutional: Negative for fever and chills.  HENT: Positive for congestion.   Eyes: Negative for blurred vision.  Respiratory: Negative for cough and shortness of breath.   Cardiovascular: Negative for chest pain.  Gastrointestinal: Negative for nausea and abdominal pain.  Genitourinary: Negative for dysuria, urgency and frequency.  Musculoskeletal: Negative for myalgias.  Skin: Negative for rash.  Neurological: Positive for headaches. Negative for dizziness and tingling.  Psychiatric/Behavioral: Negative for depression. The patient is not nervous/anxious.     Problem list and medications reviewed and updated by myself where necessary, and exist elsewhere in the encounter.   OBJECTIVE:  BP 112/64 mmHg  Pulse 97  Temp(Src) 98.3 F (36.8 C) (Oral)  Resp 16  Ht  5' 4.5" (1.638 m)  Wt 145 lb 9.6 oz (66.044 kg)  BMI 24.62 kg/m2  SpO2 97%  Physical Exam  Constitutional: She is oriented to person, place, and time. She appears well-developed.  HENT:  Right Ear: Hearing and tympanic membrane normal.  Left Ear: Hearing and tympanic membrane normal.  Nose: Nose normal.  Mouth/Throat: Uvula is midline, oropharynx is clear and moist and mucous membranes are normal.  Eyes: EOM are normal. Pupils are equal, round, and reactive to light.  Cardiovascular: Normal rate.   Pulmonary/Chest: Effort normal.  Abdominal: She exhibits no distension.  Musculoskeletal: Normal range of motion.  Neurological: She is alert and oriented to person, place, and time. No cranial nerve deficit.  Skin: Skin is warm and dry. She is not diaphoretic.  Psychiatric: She has a normal mood and affect.  Vitals reviewed.   Results for orders placed or performed in visit on 12/05/15 (from the past 48 hour(s))  POCT CBC     Status: None   Collection Time: 12/05/15  6:33 PM  Result Value Ref Range   WBC 5.6 4.6 - 10.2 K/uL   Lymph, poc 1.4 0.6 - 3.4   POC LYMPH PERCENT 25.6 10 - 50 %L   MID (cbc) 0.4 0 - 0.9   POC MID % 6.8 0 - 12 %M   POC Granulocyte 3.8 2 - 6.9   Granulocyte percent 67.6 37 - 80 %G   RBC 4.91 4.04 - 5.48 M/uL   Hemoglobin 14.3 12.2 - 16.2 g/dL  HCT, POC 42.2 37.7 - 47.9 %   MCV 86.0 80 - 97 fL   MCH, POC 29.2 27 - 31.2 pg   MCHC 34.0 31.8 - 35.4 g/dL   RDW, POC 16.1 %   Platelet Count, POC 178 142 - 424 K/uL   MPV 7.5 0 - 99.8 fL    [facility] reading (PRIMARY) by  PA Loretta Copeland: STAT read please comment.   ASSESSMENT AND PLAN  [name] was seen today for sinus problem.  Diagnoses and all orders for this visit:  Cough: Patient with likely viral URI and/or allergies.  He white count is reassuring.  I have encouraged her to stop smoking and she is in agreement.  She will purchase patches and schedule these and nicotine gum prn. Advised that if she has any  difficulty breathing and her albuterol inhaler dose not work to RTC.     -     POCT CBC -     Cancel: DG Chest 2 View; Future -     nicotine (NICODERM CQ) 14 mg/24hr patch; Place 1 patch (14 mg total) onto the skin daily. Use for three months total. -     albuterol (PROVENTIL) (2.5 MG/3ML) 0.083% nebulizer solution 2.5 mg; Take 3 mLs (2.5 mg total) by nebulization once. -     ipratropium (ATROVENT) nebulizer solution 0.5 mg; Take 2.5 mLs (0.5 mg total) by nebulization once.  Cigarette smoker one half pack a day or less -     POCT CBC -     Cancel: DG Chest 2 View; Future -     nicotine (NICODERM CQ) 14 mg/24hr patch; Place 1 patch (14 mg total) onto the skin daily. Use for three months total. -     albuterol (PROVENTIL) (2.5 MG/3ML) 0.083% nebulizer solution 2.5 mg; Take 3 mLs (2.5 mg total) by nebulization once. -     ipratropium (ATROVENT) nebulizer solution 0.5 mg; Take 2.5 mLs (0.5 mg total) by nebulization once.  Wheezing on auscultation: Patient declined radiograph.  Resolved with nebs.  Advised that she use her rescue inhaler q6 as needed.   -     POCT CBC -     Cancel: DG Chest 2 View; Future -     nicotine (NICODERM CQ) 14 mg/24hr patch; Place 1 patch (14 mg total) onto the skin daily. Use for three months total. -     albuterol (PROVENTIL) (2.5 MG/3ML) 0.083% nebulizer solution 2.5 mg; Take 3 mLs (2.5 mg total) by nebulization once. -     ipratropium (ATROVENT) nebulizer solution 0.5 mg; Take 2.5 mLs (0.5 mg total) by nebulization once.  Meds ordered this encounter  Medications  . nicotine (NICODERM CQ) 14 mg/24hr patch    Sig: Place 1 patch (14 mg total) onto the skin daily. Use for three months total.    Dispense:  28 patch    Refill:  0    Order Specific Question:  Supervising Provider    Answer:  Loretta Copeland, Loretta Copeland [[id number]]  . albuterol (PROVENTIL) (2.5 MG/3ML) 0.083% nebulizer solution 2.5 mg    Sig:   . ipratropium (ATROVENT) nebulizer solution 0.5 mg    Sig:   .  cetirizine-pseudoephedrine (ZYRTEC-D ALLERGY & CONGESTION) 5-120 MG tablet    Sig: Take 1 tablet by mouth every morning. Do not use OTC cold remedies with this medication.    Dispense:  10 tablet    Refill:  0    Order Specific Question:  Supervising Provider    Answer:  GREENE, Loretta Copeland [[id number]]  . ibuprofen (ADVIL,MOTRIN) 800 MG tablet    Sig: Take 0.5-1 tablets (400-800 mg total) by mouth 3 (three) times daily. Take with food.  Do not take Aleve or Goody's while taking this medication.    Dispense:  30 tablet    Refill:  1    Order Specific Question:  Supervising Provider    Answer:  Loretta SeatGREENE, Loretta Copeland [[id number]]  . HYDROcodone-homatropine (HYCODAN) 5-1.5 MG/5ML syrup    Sig: Take 2.5-5 mLs by mouth at bedtime as needed for cough (Do not operate heavy machinery while taking this medication.).    Dispense:  60 mL    Refill:  0    Order Specific Question:  Supervising Provider    Answer:  Loretta SeatGREENE, Loretta Copeland [[id number]]     The patient was advised to call or return to clinic if she does not see an improvement in symptoms or to seek the care of the closest emergency department if she worsens with the above plan.   Deliah BostonMichael Emmie Frakes, MHS, PA-C [facility] 12/05/2015 6:45 PM

## 2015-12-06 ENCOUNTER — Ambulatory Visit (INDEPENDENT_AMBULATORY_CARE_PROVIDER_SITE_OTHER): Payer: BLUE CROSS/BLUE SHIELD | Admitting: Family Medicine

## 2015-12-06 VITALS — BP 118/64 | HR 84 | Temp 98.3°F | Resp 18 | Ht 64.5 in | Wt 145.0 lb

## 2015-12-06 DIAGNOSIS — J209 Acute bronchitis, unspecified: Secondary | ICD-10-CM | POA: Diagnosis not present

## 2015-12-06 DIAGNOSIS — H65193 Other acute nonsuppurative otitis media, bilateral: Secondary | ICD-10-CM

## 2015-12-06 MED ORDER — LEVOFLOXACIN 500 MG PO TABS: 250.0000 mg | ORAL_TABLET | Freq: Two times a day (BID) | ORAL | Status: DC

## 2015-12-06 MED ORDER — HYDROCOD POLST-CPM POLST ER 10-8 MG/5ML PO SUER: 5.0000 mL | Freq: Two times a day (BID) | ORAL | Status: DC | PRN

## 2015-12-06 NOTE — Progress Notes (Signed)
 @  By signing my name below, I, Raven Small, attest that this documentation has been prepared under the direction and in the presence of [name], MD.  Electronically Signed: Andrew Au, ED Scribe. 12/06/2015. 6:04 PM.  Patient ID: Loretta Copeland MRN: [id number], DOB: 1956-09-06, 59 y.o. Date of Encounter: 12/06/2015, 6:04 PM  Primary Physician: No PCP Per Patient  Chief Complaint:  Chief Complaint  Patient presents with  . Sinus Problem  . Ear Pain    left    HPI: 59 y.o. year old female with history below presents with sinus congestion that started yesterday with associated sinus drainage, left ear pain with some bloody discharge, right sided HA described as pressure and cough. She was seen here yesterday by Deliah Boston for the same symptoms and was prescribed with hycodan and zyrtec. She returns today with worsening symptoms. States she had difficulty sleeping last night due to wheeze.   Clot in left canal Bilateral expiratory wheeze  Past Medical History  Diagnosis Date  . Migraines   . Arthritis   . Depression      Home Meds: Prior to Admission medications   Medication Sig Start Date End Date Taking? Authorizing Provider  albuterol (PROVENTIL HFA;VENTOLIN HFA) 108 (90 BASE) MCG/ACT inhaler Inhale 1-2 puffs into the lungs every 6 (six) hours as needed for wheezing or shortness of breath. 11/06/15  Yes [name], MD  cetirizine-pseudoephedrine (ZYRTEC-D ALLERGY & CONGESTION) 5-120 MG tablet Take 1 tablet by mouth every morning. Do not use OTC cold remedies with this medication. 12/05/15 12/15/15 Yes Ofilia Neas, PA-C  HYDROcodone-homatropine (HYCODAN) 5-1.5 MG/5ML syrup Take 2.5-5 mLs by mouth at bedtime as needed for cough (Do not operate heavy machinery while taking this medication.). 12/05/15  Yes Ofilia Neas, PA-C  ibuprofen (ADVIL,MOTRIN) 800 MG tablet Take 0.5-1 tablets (400-800 mg total) by mouth 3 (three) times daily. Take with food.  Do  not take Aleve or Goody's while taking this medication. 12/05/15 12/19/15 Yes Ofilia Neas, PA-C  LORazepam (ATIVAN) 2 MG tablet Take 1 tablet (2 mg total) by mouth every 6 (six) hours as needed for anxiety. 11/06/15  Yes [name], MD  nicotine (NICODERM CQ) 14 mg/24hr patch Place 1 patch (14 mg total) onto the skin daily. Use for three months total. 12/05/15  Yes Ofilia Neas, PA-C    Allergies:  Allergies  Allergen Reactions  . Penicillins   . Tetracyclines & Related     Social History   Social History  . Marital Status: Married    Spouse Name: N/A  . Number of Children: N/A  . Years of Education: N/A   Occupational History  . Not on file.   Social History Main Topics  . Smoking status: Current Some Day Smoker -- 1.00 packs/day for 30 years    Types: Cigarettes  . Smokeless tobacco: Not on file     Comment: not smoking every day  . Alcohol Use: No  . Drug Use: No  . Sexual Activity: No   Other Topics Concern  . Not on file   Social History Narrative     Review of Systems: Constitutional: negative for chills, fever, night sweats, weight changes, or fatigue  HEENT: negative for vision changes, hearing loss, rhinorrhea and epistaxis Cardiovascular: negative for chest pain or palpitations Respiratory: negative for hemoptysis, wheezing, shortness of breath. Abdominal: negative for abdominal pain, nausea, vomiting, diarrhea, or constipation Dermatological: negative for rash Neurologic: negative for headache, dizziness, or syncope All other  systems reviewed and are otherwise negative with the exception to those above and in the HPI.   Physical Exam: Blood pressure 118/64, pulse 84, temperature 98.3 F (36.8 C), temperature source Oral, resp. rate 18, height 5' 4.5" (1.638 m), weight 145 lb (65.772 kg), SpO2 98 %., Body mass index is 24.51 kg/(m^2). General: Well developed, well nourished, in no acute distress. Head: Normocephalic, atraumatic, eyes without  discharge, sclera non-icteric, nares are without discharge. Clots in left ear canal. Oral cavity moist, posterior pharynx without exudate, erythema, peritonsillar abscess, or post nasal drip.  Neck: Supple. No thyromegaly. Full ROM. No lymphadenopathy. Lungs: Bilateral expiratory wheeze. Breathing is unlabored. Heart: RRR with S1 S2. No murmurs, rubs, or gallops appreciated. Extremities/Skin: Warm and dry. No clubbing or cyanosis. No edema. No rashes or suspicious lesions. Neuro: Alert and oriented X 3. Moves all extremities spontaneously. Gait is normal. CNII-XII grossly in tact. Psych:  Responds to questions appropriately with a normal affect.    ASSESSMENT AND PLAN:  59 y.o. year old female with  This chart was scribed in my presence and reviewed by me personally.    ICD-9-CM ICD-10-CM   1. Acute bronchitis, unspecified organism 466.0 J20.9 chlorpheniramine-HYDROcodone (TUSSIONEX PENNKINETIC ER) 10-8 MG/5ML SUER     levofloxacin (LEVAQUIN) 500 MG tablet  2. Acute nonsuppurative otitis media of both ears 381.00 H65.193 chlorpheniramine-HYDROcodone (TUSSIONEX PENNKINETIC ER) 10-8 MG/5ML SUER     levofloxacin (LEVAQUIN) 500 MG tablet      Signed, Elvina SidleKurt Hezekiah Veltre, MD 12/06/2015 6:04 PM

## 2015-12-06 NOTE — Patient Instructions (Signed)

## 2016-05-04 ENCOUNTER — Ambulatory Visit (INDEPENDENT_AMBULATORY_CARE_PROVIDER_SITE_OTHER): Payer: Self-pay | Admitting: Family Medicine

## 2016-05-04 VITALS — BP 150/80 | HR 85 | Temp 98.5°F | Resp 18 | Ht 63.25 in | Wt 143.0 lb

## 2016-05-04 DIAGNOSIS — F419 Anxiety disorder, unspecified: Secondary | ICD-10-CM

## 2016-05-04 DIAGNOSIS — H65193 Other acute nonsuppurative otitis media, bilateral: Secondary | ICD-10-CM

## 2016-05-04 DIAGNOSIS — H60393 Other infective otitis externa, bilateral: Secondary | ICD-10-CM

## 2016-05-04 DIAGNOSIS — M25522 Pain in left elbow: Secondary | ICD-10-CM

## 2016-05-04 DIAGNOSIS — M25532 Pain in left wrist: Secondary | ICD-10-CM

## 2016-05-04 DIAGNOSIS — J209 Acute bronchitis, unspecified: Secondary | ICD-10-CM

## 2016-05-04 MED ORDER — LORAZEPAM 2 MG PO TABS: 2.0000 mg | ORAL_TABLET | Freq: Four times a day (QID) | ORAL | Status: DC | PRN

## 2016-05-04 MED ORDER — LEVOFLOXACIN 500 MG PO TABS: 250.0000 mg | ORAL_TABLET | Freq: Two times a day (BID) | ORAL | Status: DC

## 2016-05-04 MED ORDER — NEOMYCIN-POLYMYXIN-HC 3.5-10000-1 OT SOLN: 3.0000 [drp] | Freq: Four times a day (QID) | OTIC | Status: DC

## 2016-05-04 MED ORDER — DICLOFENAC SODIUM 1 % TD GEL: 2.0000 g | Freq: Four times a day (QID) | TRANSDERMAL | Status: AC

## 2016-05-04 MED ORDER — HYDROCOD POLST-CPM POLST ER 10-8 MG/5ML PO SUER: 5.0000 mL | Freq: Two times a day (BID) | ORAL | Status: DC | PRN

## 2016-05-04 NOTE — Progress Notes (Signed)
60 yo woman with joint pain and left ear pain.  These are ongoing for many years.  She has tried OTC cream without success for the left wrist and elbow  The ear pain is recurrent.   Objective:  BP 150/80 mmHg  Pulse 85  Temp(Src) 98.5 F (36.9 C) (Oral)  Resp 18  Ht 5' 3.25" (1.607 m)  Wt 143 lb (64.864 kg)  BMI 25.12 kg/m2  SpO2 96%  NAD Wt Readings from Last 3 Encounters:  05/04/16 143 lb (64.864 kg)  12/06/15 145 lb (65.772 kg)  12/05/15 145 lb 9.6 oz (66.044 kg)   Left ear with cerumen impaction Right ear small amount of wax, otherwise negative Extremities:  Normal ROM and inspection, normal palpation  Assessment:  Left wrist pain - Plan: diclofenac sodium (VOLTAREN) 1 % GEL  Elbow pain, left - Plan: diclofenac sodium (VOLTAREN) 1 % GEL  Otitis, externa, infective, bilateral - Plan: neomycin-polymyxin-hydrocortisone (CORTISPORIN) otic solution  Anxiety - Plan: LORazepam (ATIVAN) 2 MG tablet  Elvina SidleKurt Tamula Morrical, MD

## 2016-05-04 NOTE — Patient Instructions (Signed)
     IF you received an x-ray today, you will receive an invoice from [facility]. Please contact [facility] at [phone] with questions or concerns regarding your invoice.   IF you received labwork today, you will receive an invoice from [facility]. Please contact [facility] at [phone] with questions or concerns regarding your invoice.   Our billing staff will not be able to assist you with questions regarding bills from these companies.  You will be contacted with the lab results as soon as they are available. The fastest way to get your results is to activate your My Chart account. Instructions are located on the last page of this paperwork. If you have not heard from us regarding the results in 2 weeks, please contact this office.      

## 2016-09-07 ENCOUNTER — Ambulatory Visit (INDEPENDENT_AMBULATORY_CARE_PROVIDER_SITE_OTHER): Payer: Self-pay | Admitting: Family Medicine

## 2016-09-07 ENCOUNTER — Encounter: Payer: Self-pay | Admitting: Family Medicine

## 2016-09-07 DIAGNOSIS — J209 Acute bronchitis, unspecified: Secondary | ICD-10-CM

## 2016-09-07 DIAGNOSIS — J320 Chronic maxillary sinusitis: Secondary | ICD-10-CM

## 2016-09-07 DIAGNOSIS — F419 Anxiety disorder, unspecified: Secondary | ICD-10-CM

## 2016-09-07 DIAGNOSIS — J329 Chronic sinusitis, unspecified: Secondary | ICD-10-CM | POA: Insufficient documentation

## 2016-09-07 DIAGNOSIS — H65193 Other acute nonsuppurative otitis media, bilateral: Secondary | ICD-10-CM

## 2016-09-07 MED ORDER — HYDROCOD POLST-CPM POLST ER 10-8 MG/5ML PO SUER: 5.0000 mL | Freq: Two times a day (BID) | ORAL | 0 refills | Status: DC | PRN

## 2016-09-07 MED ORDER — LEVOFLOXACIN 500 MG PO TABS: 250.0000 mg | ORAL_TABLET | Freq: Three times a day (TID) | ORAL | 0 refills | Status: DC

## 2016-09-07 MED ORDER — LORAZEPAM 2 MG PO TABS: 2.0000 mg | ORAL_TABLET | Freq: Four times a day (QID) | ORAL | 5 refills | Status: DC | PRN

## 2016-09-07 NOTE — Progress Notes (Signed)
60 yo married woman with 5 days of progressive cough and sinus congestion, no fever.  No SOB  No hemoptysis or epistaxis.  OTC's not helping  Objective:  BP 124/72   Pulse 85   Temp 98.2 F (36.8 C) (Oral)   Resp 18   Ht 5' 3.25" (1.607 m)   Wt 142 lb 3.2 oz (64.5 kg)   SpO2 97%   BMI 24.99 kg/m  HEENT:  Unremarkable.  Edentulous with PND.  TM's normal Neck:  Supple Chest:  No resp distress, few ronchi Heart:  Reg, no murmur Skin:  Warm and dry.  Assessment:  Chronic maxillary sinusitis  Acute bronchitis, unspecified organism - Plan: chlorpheniramine-HYDROcodone (TUSSIONEX PENNKINETIC ER) 10-8 MG/5ML SUER, levofloxacin (LEVAQUIN) 500 MG tablet  Acute nonsuppurative otitis media of both ears - Plan: chlorpheniramine-HYDROcodone (TUSSIONEX PENNKINETIC ER) 10-8 MG/5ML SUER, levofloxacin (LEVAQUIN) 500 MG tablet  Anxiety - Plan: LORazepam (ATIVAN) 2 MG tablet  Elvina SidleKurt Avni Traore,  MD

## 2016-09-07 NOTE — Patient Instructions (Signed)
     IF you received an x-ray today, you will receive an invoice from [facility]. Please contact [facility] at [phone] with questions or concerns regarding your invoice.   IF you received labwork today, you will receive an invoice from [facility]. Please contact [facility] at [phone] with questions or concerns regarding your invoice.   Our billing staff will not be able to assist you with questions regarding bills from these companies.  You will be contacted with the lab results as soon as they are available. The fastest way to get your results is to activate your My Chart account. Instructions are located on the last page of this paperwork. If you have not heard from us regarding the results in 2 weeks, please contact this office.      

## 2016-11-15 DIAGNOSIS — I809 Phlebitis and thrombophlebitis of unspecified site: Secondary | ICD-10-CM

## 2016-11-15 HISTORY — DX: Phlebitis and thrombophlebitis of unspecified site: I80.9

## 2016-12-02 ENCOUNTER — Ambulatory Visit: Payer: Self-pay

## 2016-12-07 ENCOUNTER — Emergency Department (HOSPITAL_COMMUNITY)
Admission: EM | Admit: 2016-12-07 | Discharge: 2016-12-07 | Disposition: A | Discharge status: Home / Self Care | Payer: Self-pay | Attending: Emergency Medicine | Admitting: Emergency Medicine

## 2016-12-07 ENCOUNTER — Encounter (HOSPITAL_COMMUNITY): Payer: Self-pay | Admitting: Nurse Practitioner

## 2016-12-07 ENCOUNTER — Emergency Department (EMERGENCY_DEPARTMENT_HOSPITAL)
Admission: EM | Admit: 2016-12-07 | Discharge: 2016-12-07 | Disposition: A | Discharge status: Home / Self Care | Payer: Self-pay | Source: Home / Self Care

## 2016-12-07 DIAGNOSIS — Z7901 Long term (current) use of anticoagulants: Secondary | ICD-10-CM | POA: Insufficient documentation

## 2016-12-07 DIAGNOSIS — F17210 Nicotine dependence, cigarettes, uncomplicated: Secondary | ICD-10-CM | POA: Insufficient documentation

## 2016-12-07 DIAGNOSIS — Z79899 Other long term (current) drug therapy: Secondary | ICD-10-CM | POA: Insufficient documentation

## 2016-12-07 DIAGNOSIS — I803 Phlebitis and thrombophlebitis of lower extremities, unspecified: Secondary | ICD-10-CM | POA: Insufficient documentation

## 2016-12-07 DIAGNOSIS — I809 Phlebitis and thrombophlebitis of unspecified site: Secondary | ICD-10-CM

## 2016-12-07 DIAGNOSIS — M79609 Pain in unspecified limb: Secondary | ICD-10-CM

## 2016-12-07 MED ORDER — RIVAROXABAN 10 MG PO TABS: 10.0000 mg | ORAL_TABLET | Freq: Every day | ORAL | 0 refills | Status: DC

## 2016-12-07 MED ORDER — RIVAROXABAN (XARELTO) EDUCATION KIT FOR DVT/PE PATIENTS
PACK | Freq: Once | Status: AC
Start: 2016-12-07 — End: 2016-12-07
  Administered 2016-12-07: 21:00:00
  Filled 2016-12-07: qty 1

## 2016-12-07 NOTE — ED Triage Notes (Signed)
Pt has been referred [facility] for evaluation for right leg pain with possibility of thrombophlebitis. Evident inflammatory-like striking of the medial tibial vein with superficial hardening on the medial popliteal aspect. Onset 8 days ago.

## 2016-12-07 NOTE — Discharge Instructions (Signed)
Please read attached information. If you experience any new or worsening signs or symptoms please return to the emergency room for evaluation. Please follow-up with your primary care provider or specialist as discussed. Please use medication prescribed only as directed and discontinue taking if you have any concerning signs or symptoms.   °

## 2016-12-07 NOTE — ED Provider Notes (Signed)
[facility] DEPT Provider Note   CSN: [id number] Arrival date & time: 12/07/16  1512  By signing my name below, I, Hansel Feinstein, attest that this documentation has been prepared under the direction and in the presence of [employer], PA-C. Electronically Signed: Hansel Feinstein, ED Scribe. 12/07/16. 8:23 PM.    History   Chief Complaint Chief Complaint  Patient presents with  . Leg Pain    R/O Thrombophlebitis    HPI Loretta Copeland is a 60 y.o. female who presents to the Emergency Department complaining of a moderate, gradually worsening area of redness to the medial right calf x 2 weeks. Per pt, the area is not painful. She was seen by her PCP today and referred here for r/o of thrombophlebitis. Per pt, she applied a topical arthritis ointment with no relief of redness. She denies fever, additional complaints.   Patient denies any history of anticoagulation, denies any history of bleeding, dark stools, or bleeding disorders. No recent surgeries or trauma.  The history is provided by the patient. No language interpreter was used.    Past Medical History:  Diagnosis Date  . Arthritis   . Depression   . Migraines     Patient Active Problem List   Diagnosis Date Noted  . Sinusitis, chronic 09/07/2016  . Smoking 11/06/2015    History reviewed. No pertinent surgical history.  OB History    No data available       Home Medications    Prior to Admission medications   Medication Sig Start Date End Date Taking? Authorizing Provider  albuterol (PROVENTIL HFA;VENTOLIN HFA) 108 (90 BASE) MCG/ACT inhaler Inhale 1-2 puffs into the lungs every 6 (six) hours as needed for wheezing or shortness of breath. 11/06/15   Robyn Haber, MD  chlorpheniramine-HYDROcodone [facility]) 10-8 MG/5ML SUER Take 5 mLs by mouth every 12 (twelve) hours as needed for cough. 09/07/16   Robyn Haber, MD  diclofenac sodium (VOLTAREN) 1 % GEL Apply 2 g topically 4 (four) times daily.  05/04/16   Robyn Haber, MD  levofloxacin (LEVAQUIN) 500 MG tablet Take 0.5 tablets (250 mg total) by mouth 3 (three) times daily. 09/07/16   Robyn Haber, MD  LORazepam (ATIVAN) 2 MG tablet Take 1 tablet (2 mg total) by mouth every 6 (six) hours as needed for anxiety. 09/07/16   Robyn Haber, MD  nicotine (NICODERM CQ) 14 mg/24hr patch Place 1 patch (14 mg total) onto the skin daily. Use for three months total. 12/05/15   Tereasa Coop, PA-C  rivaroxaban (XARELTO) 10 MG TABS tablet Take 1 tablet (10 mg total) by mouth daily. 12/07/16   Okey Regal, PA-C    Family History Family History  Problem Relation Age of Onset  . Diabetes Mother   . Diabetes Father   . Diabetes Brother   . Diabetes Daughter     Social History Social History  Substance Use Topics  . Smoking status: Current Some Day Smoker    Packs/day: 1.00    Years: 30.00    Types: Cigarettes  . Smokeless tobacco: Never Used     Comment: not smoking every day  . Alcohol use No     Allergies   Penicillins and Tetracyclines & related   Review of Systems Review of Systems A complete 10 system review of systems was obtained and all systems are negative except as noted in the HPI and PMH.    Physical Exam Updated Vital Signs BP 132/78 (BP Location: Right Arm)  Pulse 86   Temp 98.2 F (36.8 C) (Oral)   Resp 18   SpO2 98%   Physical Exam  Constitutional: She appears well-developed and well-nourished.  HENT:  Head: Normocephalic.  Eyes: Conjunctivae are normal.  Cardiovascular: Normal rate.   Pulmonary/Chest: Effort normal. No respiratory distress.  Abdominal: She exhibits no distension.  Musculoskeletal: Normal range of motion. She exhibits no tenderness.  Inflammation and redness along the medial aspect of the right calf extending up to the knee. Pedal pulse 2+. No signs of cellulitis. Nontender.    Neurological: She is alert.  Skin: Skin is warm and dry.  Psychiatric: She has a normal mood and  affect. Her behavior is normal.  Nursing note and vitals reviewed.    ED Treatments / Results   DIAGNOSTIC STUDIES: Oxygen Saturation is 98% on RA, normal by my interpretation.    COORDINATION OF CARE: 8:19 PM Discussed treatment plan with pt at bedside which includes [place] and pt agreed to plan.    Labs (all labs ordered are listed, but only abnormal results are displayed) Labs Reviewed - No data to display  EKG  EKG Interpretation None       Radiology No results found.  Procedures Procedures (including critical care time)  Medications Ordered in ED Medications  rivaroxaban Alveda Reasons) Education Kit for DVT/PE patients (not administered)     Initial Impression / Assessment and Plan / ED Course  I have reviewed the triage vital signs and the nursing notes.  Pertinent labs & imaging results that were available during my care of the patient were reviewed by me and considered in my medical decision making (see chart for details).  Clinical Course     Labs:  Imaging: LE venous US   Consults:  Therapeutics:  Discharge Meds:   Assessment/Plan:  60 year old female presents today with thrombophlebitis of her right leg. This is an extensive segment, she will be placed on Xarelto. She has no contraindications to this. She was given starter pack and counseling I pharmacy. I spoke with her daughter on the phone about return precautions and medication management. Patient and her husband both verbalized understanding and agreement to today's plan had no further questions or concerns at time of discharge   Final Clinical Impressions(s) / ED Diagnoses   Final diagnoses:  Thrombophlebitis   I personally performed the services described in this documentation, which was scribed in my presence. The recorded information has been reviewed and is accurate.  New Prescriptions New Prescriptions   RIVAROXABAN (XARELTO) 10 MG TABS TABLET    Take 1 tablet (10 mg total) by mouth  daily.       Okey Regal, PA-C 12/07/16 2128    Forde Dandy, MD 12/08/16 [phone]

## 2016-12-07 NOTE — Progress Notes (Signed)
VASCULAR LAB PRELIMINARY  PRELIMINARY  PRELIMINARY  PRELIMINARY  Right lower extremity venous duplex completed.    Preliminary report:  There is no DVT noted in the right lower extremity.  There is superficial thrombophlebitis noted in the right greater saphenous vein from the ankle to the mid to distal thigh.   Gave report to [employer] IncorporatedPatti, Charge RN  Azie Mcconahy, Sonny MastersANDACE, RVT 12/07/2016, 6:56 PM

## 2016-12-10 NOTE — ED Provider Notes (Signed)
[facility] from [facility] Selena Batten(Kim).  Pt was given prescription for 10mg  Xarelto, to be taken once daily and was given a coupon for 15mg  or 20mg  tablets.  I reviewed the chart, discussed the dosage and patient with the [facility]ist and with Dr Ethelda ChickJacubowitz.  I attempted to call the [facility] where pt was initially seen by was unable to get through to a person.  Per our discussions, the patient was discharged with a prescription more typically used for post-operative patients.  In DVT, the dosage is typically 15mg  BID initially.  Pt did not have DVT but had concerning large area of thrombophlebitis and it was decided to start her on Xarelto.  Given this clinical scenario and no noted reason for decreased dosage, Dr Ethelda ChickJacubowitz and I decided to switch patient to DVT-dosing.  I have asked the pharmacy representative Selena BattenKim to have the patient discuss her new medication with her primary care provider promptly.     Trixie Dredgemily Nomie Buchberger, PA-C 12/10/16 1746    Doug SouSam Jacubowitz, MD 12/11/16 [phone]

## 2017-04-01 ENCOUNTER — Ambulatory Visit (INDEPENDENT_AMBULATORY_CARE_PROVIDER_SITE_OTHER): Payer: Self-pay | Admitting: Physician Assistant

## 2017-04-01 ENCOUNTER — Other Ambulatory Visit: Payer: Self-pay | Admitting: Family Medicine

## 2017-04-01 VITALS — BP 140/88 | HR 95 | Temp 98.8°F | Resp 16 | Ht 63.2 in | Wt 141.0 lb

## 2017-04-01 DIAGNOSIS — F411 Generalized anxiety disorder: Secondary | ICD-10-CM

## 2017-04-01 DIAGNOSIS — F32A Depression, unspecified: Secondary | ICD-10-CM

## 2017-04-01 DIAGNOSIS — F419 Anxiety disorder, unspecified: Secondary | ICD-10-CM

## 2017-04-01 DIAGNOSIS — F329 Major depressive disorder, single episode, unspecified: Secondary | ICD-10-CM

## 2017-04-01 MED ORDER — ESCITALOPRAM OXALATE 10 MG PO TABS: 10.0000 mg | ORAL_TABLET | Freq: Every day | ORAL | 2 refills | Status: DC

## 2017-04-01 MED ORDER — LORAZEPAM 2 MG PO TABS: 2.0000 mg | ORAL_TABLET | Freq: Three times a day (TID) | ORAL | 0 refills | Status: DC | PRN

## 2017-04-01 NOTE — Patient Instructions (Addendum)
I need you to return in 1 month for us to recheck how you are doing on the leKoreaxapro.  Please take the medication as prescribed.   I would like you to contact [facility] for an appointment with a therapist.      IF you received an x-ray today, you will receive an invoice from [facility]. Please contact [facility] at [phone] with questions or concerns regarding your invoice.   IF you received labwork today, you will receive an invoice from [facility]. Please contact [facility] at [phone] with questions or concerns regarding your invoice.   Our billing staff will not be able to assist you with questions regarding bills from these companies.  You will be contacted with the lab results as soon as they are available. The fastest way to get your results is to activate your My Chart account. Instructions are located on the last page of this paperwork. If you have not heard from [place] regarding the results in 2 weeks, please contact this office.

## 2017-04-01 NOTE — Progress Notes (Signed)
PRIMARY CARE AT [facility] [address], [place] [address] [phone]  Date:  04/01/2017   Name:  Loretta Copeland   DOB:  02-23-1956   MRN:  [id number]  PCP:  No PCP Per Patient    History of Present Illness:  Loretta Copeland is a 61 y.o. female patient who presents to PCP with  Chief Complaint  Patient presents with  . Medication Refill    lorazepam     Patient states that she is taking the clonazepam-- She had issues with the anxiety medication.   She states that there are some stressors with her daughter diagnosed with diabetes. She says she takes the lorazepam when needed.  She can not state how often that she take the medication per day. She will not answer if she has anxiety attacks.  States that she does not want to go in the past. Patient is [place], and left around the [place] war several decades ago.  This helps with her sleep.  She states that she has a lot of stressors with her mother and father no longer living, and in [place].  She was not able to have closure. Reviewing past records from archives, she had attempted citalopram and fluoxetine, which looked like it was less than a week.  She states that this did nothing.       Patient Active Problem List   Diagnosis Date Noted  . Sinusitis, chronic 09/07/2016  . Smoking 11/06/2015    Past Medical History:  Diagnosis Date  . Arthritis   . Depression   . Migraines     No past surgical history on file.  Social History  Substance Use Topics  . Smoking status: Current Some Day Smoker    Packs/day: 1.00    Years: 30.00    Types: Cigarettes  . Smokeless tobacco: Never Used     Comment: not smoking every day  . Alcohol use No    Family History  Problem Relation Age of Onset  . Diabetes Mother   . Diabetes Father   . Diabetes Brother   . Diabetes Daughter     Allergies  Allergen Reactions  . Penicillins   . Tetracyclines & Related     Medication list has been reviewed and updated.  Current  Outpatient Prescriptions on File Prior to Visit  Medication Sig Dispense Refill  . albuterol (PROVENTIL HFA;VENTOLIN HFA) 108 (90 BASE) MCG/ACT inhaler Inhale 1-2 puffs into the lungs every 6 (six) hours as needed for wheezing or shortness of breath. 1 Inhaler 0  . LORazepam (ATIVAN) 2 MG tablet Take 1 tablet (2 mg total) by mouth every 6 (six) hours as needed for anxiety. 120 tablet 5  . nicotine (NICODERM CQ) 14 mg/24hr patch Place 1 patch (14 mg total) onto the skin daily. Use for three months total. 28 patch 0  . rivaroxaban (XARELTO) 10 MG TABS tablet Take 1 tablet (10 mg total) by mouth daily. 45 tablet 0  . chlorpheniramine-HYDROcodone (TUSSIONEX PENNKINETIC ER) 10-8 MG/5ML SUER Take 5 mLs by mouth every 12 (twelve) hours as needed for cough. (Patient not taking: Reported on 04/01/2017) 60 mL 0  . diclofenac sodium (VOLTAREN) 1 % GEL Apply 2 g topically 4 (four) times daily. (Patient not taking: Reported on 04/01/2017) 100 g 2   No current facility-administered medications on file prior to visit.     ROS ROS otherwise unremarkable unless listed above.  Physical Examination: BP 140/88   Pulse 95   Temp 98.8 F (37.1  C) (Oral)   Resp 16   Ht 5' 3.2" (1.605 m)   Wt 141 lb (64 kg)   SpO2 96%   BMI 24.82 kg/m  Ideal Body Weight: Weight in (lb) to have BMI = 25: 141.7  Physical Exam  Constitutional: She is oriented to person, place, and time. She appears well-developed and well-nourished. No distress.  HENT:  Head: Normocephalic and atraumatic.  Right Ear: External ear normal.  Left Ear: External ear normal.  Eyes: Conjunctivae and EOM are normal. Pupils are equal, round, and reactive to light.  Cardiovascular: Normal rate.   Pulmonary/Chest: Effort normal. No respiratory distress.  Neurological: She is alert and oriented to person, place, and time.  Skin: She is not diaphoretic.  Psychiatric: She has a normal mood and affect. Her behavior is normal.     Assessment and  Plan: Loretta Copeland is a 61 y.o. female who is here today for refill of medication. There is not a clear history in review to her anxiety features.  I am decreasing at  tid, from every 6 hours.  We will attempt lexapro.  She will follow up in about 1 month for recheck.  Advised that she should try therapy, and given information for [facility].   If we can not continue to decrease ativan, and without an indication of need, besides insomnia, will likely refer to psychiatry.   Generalized anxiety disorder - Plan: escitalopram (LEXAPRO) 10 MG tablet  Depression, unspecified depression type - Plan: escitalopram (LEXAPRO) 10 MG tablet  Anxiety - Plan: LORazepam (ATIVAN) 2 MG tablet  [name], PA-C [facility] 4/18/20189:13 PM

## 2017-04-29 ENCOUNTER — Ambulatory Visit: Payer: Self-pay | Admitting: Physician Assistant

## 2017-04-30 ENCOUNTER — Encounter: Payer: Self-pay | Admitting: Physician Assistant

## 2017-04-30 ENCOUNTER — Ambulatory Visit (INDEPENDENT_AMBULATORY_CARE_PROVIDER_SITE_OTHER): Payer: Self-pay | Admitting: Physician Assistant

## 2017-04-30 VITALS — BP 123/77 | HR 101 | Temp 98.3°F | Resp 16 | Ht 64.25 in | Wt 138.2 lb

## 2017-04-30 DIAGNOSIS — H6690 Otitis media, unspecified, unspecified ear: Secondary | ICD-10-CM

## 2017-04-30 DIAGNOSIS — F419 Anxiety disorder, unspecified: Secondary | ICD-10-CM

## 2017-04-30 MED ORDER — HYDROCORTISONE-ACETIC ACID 1-2 % OT SOLN: 3.0000 [drp] | Freq: Two times a day (BID) | OTIC | 0 refills | Status: AC

## 2017-04-30 MED ORDER — LORAZEPAM 2 MG PO TABS: 2.0000 mg | ORAL_TABLET | Freq: Two times a day (BID) | ORAL | 0 refills | Status: DC | PRN

## 2017-04-30 MED ORDER — AZITHROMYCIN 250 MG PO TABS: ORAL_TABLET | ORAL | 0 refills | Status: AC

## 2017-04-30 NOTE — Patient Instructions (Addendum)
     IF you received an x-ray today, you will receive an invoice from [facility]. Please contact [facility] at [phone] with questions or concerns regarding your invoice.   IF you received labwork today, you will receive an invoice from [facility]. Please contact [facility] at [phone] with questions or concerns regarding your invoice.   Our billing staff will not be able to assist you with questions regarding bills from these companies.  You will be contacted with the lab results as soon as they are available. The fastest way to get your results is to activate your My Chart account. Instructions are located on the last page of this paperwork. If you have not heard from us regarding the results in 2 weeks, please contact this office.   Take tylenol for the pain.  You can take the tylenol with food. 1000mg  every 8 hours as needed for pain. Otitis Media, Adult Otitis media is redness, soreness, and puffiness (swelling) in the space just behind your eardrum (middle ear). It may be caused by allergies or infection. It often happens along with a cold. Follow these instructions at home:  Take your medicine as told. Finish it even if you start to feel better.  Only take over-the-counter or prescription medicines for pain, discomfort, or fever as told by your doctor.  Follow up with your doctor as told. Contact a doctor if:  You have otitis media only in one ear, or bleeding from your nose, or both.  You notice a lump on your neck.  You are not getting better in 3-5 days.  You feel worse instead of better. Get help right away if:  You have pain that is not helped with medicine.  You have puffiness, redness, or pain around your ear.  You get a stiff neck.  You cannot move part of your face (paralysis).  You notice that the bone behind your ear hurts when you touch it. This information is not intended to replace advice given to you by your health care provider.  Make sure you discuss any questions you have with your health care provider. Document Released: 05/20/2008 Document Revised: 05/09/2016 Document Reviewed: 06/29/2013 Elsevier Interactive Patient Education  2017 [employer]  We recommend that you schedule a mammogram for breast cancer screening. Typically, you do not need a referral to do this. Please contact a local imaging center to schedule your mammogram.  [facility] - [phone]  *ask for the Radiology Department [facility]) - [phone] or [phone]  [facility] - [phone] [facility] - [phone] [facility] - [phone]  *ask for the Radiology Department [facility] - [phone]  *ask for the Radiology Department [facility] - [phone]  *ask for the Mammography Department [facility] - [phone]

## 2017-05-03 NOTE — Progress Notes (Signed)
PRIMARY CARE AT [facility] [address] [place] [address] [phone]  Date:  04/30/2017   Name:  Loretta Copeland   DOB:  04-Jul-1956   MRN:  [id number]  PCP:  Patient, No Pcp Per    History of Present Illness:  Loretta Copeland is a 61 y.o. female patient who presents to PCP with  Chief Complaint  Patient presents with  . Ear Pain    LEFT - LANGUAGE BARRIER and STRATUS IS NOT RESPONDING     Translator utilized at visit Patient reports left ear pain that has gone on for several days.  There is no drainage, no fever.  She has a hx of ear problems, and was advised to have a procedure, but she declined as she does not have insurance.   She is also here for medication refill of the ativan.  She was started 1 month ago, on lexapro.  She attempted to take half tablet, but when she went to a full tablet (20mg  daily), but has abdominal pain.  When she stopped, she did not have abdominal pain.  She then started it at half tablet.  She can not tell if this is helping.  Her ativan was also reduced to three times per day, from every 6 hours.  She has extra tablets left, she notes.   She states that "you do not know my life".  Her husband is working, she volunteers sometimes.  Her daughter has some medical issues, and she is stressed from her life.  No side effects from the reduction.  She still denies any panic attacks.    Patient Active Problem List   Diagnosis Date Noted  . Sinusitis, chronic 09/07/2016  . Smoking 11/06/2015    Past Medical History:  Diagnosis Date  . Arthritis   . Depression   . Migraines     No past surgical history on file.  Social History  Substance Use Topics  . Smoking status: Current Some Day Smoker    Packs/day: 1.00    Years: 30.00    Types: Cigarettes  . Smokeless tobacco: Never Used     Comment: not smoking every day  . Alcohol use No    Family History  Problem Relation Age of Onset  . Diabetes Mother   . Diabetes Father   . Diabetes Brother   .  Diabetes Daughter     Allergies  Allergen Reactions  . Penicillins   . Tetracyclines & Related     Medication list has been reviewed and updated.  Current Outpatient Prescriptions on File Prior to Visit  Medication Sig Dispense Refill  . albuterol (PROVENTIL HFA;VENTOLIN HFA) 108 (90 BASE) MCG/ACT inhaler Inhale 1-2 puffs into the lungs every 6 (six) hours as needed for wheezing or shortness of breath. 1 Inhaler 0  . chlorpheniramine-HYDROcodone (TUSSIONEX PENNKINETIC ER) 10-8 MG/5ML SUER Take 5 mLs by mouth every 12 (twelve) hours as needed for cough. (Patient not taking: Reported on 04/01/2017) 60 mL 0  . diclofenac sodium (VOLTAREN) 1 % GEL Apply 2 g topically 4 (four) times daily. (Patient not taking: Reported on 04/01/2017) 100 g 2  . escitalopram (LEXAPRO) 10 MG tablet Take 1 tablet (10 mg total) by mouth daily. Start with half tablet for 1 week, then increase to one tablet. 30 tablet 2  . nicotine (NICODERM CQ) 14 mg/24hr patch Place 1 patch (14 mg total) onto the skin daily. Use for three months total. 28 patch 0  . rivaroxaban (XARELTO) 10 MG TABS tablet  Take 1 tablet (10 mg total) by mouth daily. 45 tablet 0   No current facility-administered medications on file prior to visit.     ROS ROS otherwise unremarkable unless listed above.  Physical Examination: BP 123/77 (BP Location: Right Arm, Patient Position: Sitting, Cuff Size: Large)   Pulse (!) 101   Temp 98.3 F (36.8 C) (Oral)   Resp 16   Ht 5' 4.25" (1.632 m)   Wt 138 lb 3.2 oz (62.7 kg)   SpO2 96%   BMI 23.54 kg/m  Ideal Body Weight: Weight in (lb) to have BMI = 25: 146.5  Physical Exam  Constitutional: She is oriented to person, place, and time. She appears well-developed and well-nourished. No distress.  HENT:  Head: Normocephalic and atraumatic.  Right Ear: External ear and ear canal normal.  Left Ear: External ear and ear canal normal.  Nose: No mucosal edema or rhinorrhea. Right sinus exhibits no  maxillary sinus tenderness and no frontal sinus tenderness. Left sinus exhibits no maxillary sinus tenderness and no frontal sinus tenderness.  Mouth/Throat: No uvula swelling. No oropharyngeal exudate, posterior oropharyngeal edema or posterior oropharyngeal erythema.  Left tm with thickened yellow tm, without translucency.  Darkened ecchymotic area at the 7 oclock region  Eyes: Conjunctivae and EOM are normal. Pupils are equal, round, and reactive to light.  Cardiovascular: Normal rate and regular rhythm.  Exam reveals no gallop, no distant heart sounds and no friction rub.   No murmur heard. Pulmonary/Chest: Effort normal. No apnea. No respiratory distress. She has no decreased breath sounds. She has no wheezes. She has no rhonchi.  Lymphadenopathy:       Head (right side): No submandibular, no tonsillar, no preauricular and no posterior auricular adenopathy present.       Head (left side): No submandibular, no tonsillar, no preauricular and no posterior auricular adenopathy present.  Neurological: She is alert and oriented to person, place, and time.  Skin: She is not diaphoretic.  Psychiatric: She has a normal mood and affect. Her behavior is normal.     Assessment and Plan: Loretta Copeland is a 61 y.o. female who is here today for cc of anxiety and ear pain Patient is refusing to go to  Psychiatrist due to cost.  I have advised to take the half tablet of the lexapro She appears upset when I reduce the ativan to twice per day, but I have urged and educated her of the high dose of the ativan. Will start a topical and oral abx at this time.   She likely needs to see ent, and will refer if no improvement of the pain. Subacute otitis media, unspecified otitis media type - Plan: acetic acid-hydrocortisone (VOSOL-HC) otic solution, azithromycin (ZITHROMAX) 250 MG tablet  Anxiety - Plan: LORazepam (ATIVAN) 2 MG tablet  Loretta PlattStephanie Albino Bufford, PA-C [facility] 5/19/20189:17 AM

## 2017-05-21 ENCOUNTER — Encounter: Payer: Self-pay | Admitting: Family Medicine

## 2017-05-21 ENCOUNTER — Ambulatory Visit (INDEPENDENT_AMBULATORY_CARE_PROVIDER_SITE_OTHER): Payer: Self-pay | Admitting: Family Medicine

## 2017-05-21 VITALS — BP 130/79 | HR 101 | Temp 97.7°F | Resp 16 | Ht 64.25 in | Wt 138.0 lb

## 2017-05-21 DIAGNOSIS — F419 Anxiety disorder, unspecified: Secondary | ICD-10-CM

## 2017-05-21 DIAGNOSIS — J329 Chronic sinusitis, unspecified: Secondary | ICD-10-CM

## 2017-05-21 DIAGNOSIS — H6980 Other specified disorders of Eustachian tube, unspecified ear: Secondary | ICD-10-CM

## 2017-05-21 MED ORDER — CITALOPRAM HYDROBROMIDE 20 MG PO TABS: 20.0000 mg | ORAL_TABLET | Freq: Every day | ORAL | 3 refills | Status: DC

## 2017-05-21 MED ORDER — LORAZEPAM 2 MG PO TABS: 2.0000 mg | ORAL_TABLET | Freq: Two times a day (BID) | ORAL | 0 refills | Status: DC | PRN

## 2017-05-21 MED ORDER — FLUTICASONE PROPIONATE 50 MCG/ACT NA SUSP: 2.0000 | Freq: Every day | NASAL | 6 refills | Status: DC

## 2017-05-21 NOTE — Progress Notes (Signed)
Loretta Copeland is a 61 y.o. female who presents to Primary Care at [facility] today for  Chief Complaint  Patient presents with  . Medication Reaction    per patient, has nausea using Lexapro, states that it "is not good"  . Sinus Problem    per patient, began appx 2 weeks ago   *Attemped to use [place] interpreter during visit but the video interpreter continued to cut out several times. Patient said her English was ok to not use an interpreter*  1. Anxiety: Patient notes that her anxiety is still the same. She is very anxious about the health of her family and about money issues. She notes that she was placed on Lexapro on 5/16 but after taking only 1 pill she had nausea, vomiting, abdominal pain, and GI upset. She stopped this medication after that.   2. Sinus issues and ear discomfort: Patient notes that she has had some nasal congestion as well as some ear discomfort. Was treated for AOM last month. Denies any fevers or chills, sore throat. No cough.   ROS as above.  Pertinently, no chest pain, palpitations, SOB, Fever, Chills, Abd pain, N/V/D.   PMH reviewed. Patient is a nonsmoker.   Past Medical History:  Diagnosis Date  . Arthritis   . Depression   . Migraines    No past surgical history on file.  Medications reviewed. Current Outpatient Prescriptions  Medication Sig Dispense Refill  . albuterol (PROVENTIL HFA;VENTOLIN HFA) 108 (90 BASE) MCG/ACT inhaler Inhale 1-2 puffs into the lungs every 6 (six) hours as needed for wheezing or shortness of breath. 1 Inhaler 0  . chlorpheniramine-HYDROcodone (TUSSIONEX PENNKINETIC ER) 10-8 MG/5ML SUER Take 5 mLs by mouth every 12 (twelve) hours as needed for cough. 60 mL 0  . diclofenac sodium (VOLTAREN) 1 % GEL Apply 2 g topically 4 (four) times daily. 100 g 2  . LORazepam (ATIVAN) 2 MG tablet Take 1 tablet (2 mg total) by mouth every 12 (twelve) hours as needed for anxiety. 60 tablet 0  . nicotine (NICODERM CQ) 14 mg/24hr patch Place 1  patch (14 mg total) onto the skin daily. Use for three months total. 28 patch 0  . rivaroxaban (XARELTO) 10 MG TABS tablet Take 1 tablet (10 mg total) by mouth daily. 45 tablet 0  . escitalopram (LEXAPRO) 10 MG tablet Take 1 tablet (10 mg total) by mouth daily. Start with half tablet for 1 week, then increase to one tablet. (Patient not taking: Reported on 05/21/2017) 30 tablet 2   No current facility-administered medications for this visit.      Physical Exam:  BP 130/79 (BP Location: Right Arm, Patient Position: Sitting, Cuff Size: Normal)   Pulse (!) 101   Temp 97.7 F (36.5 C) (Oral)   Resp 16   Ht 5' 4.25" (1.632 m)   Wt 138 lb (62.6 kg)   BMI 23.50 kg/m  Gen:  Alert, cooperative patient who appears stated age in no acute distress.  Vital signs reviewed. HEENT:     Head: Normocephalic, atraumatic    Neck: No masses palpated. No goiter. No lymphadenopathy     Ears: External ears normal, no drainage.Tympanic membranes intact, normal light reflex bilaterally, no erythema or bulging    Eyes: PERRLA, EOMI, sclera white, normal conjunctiva    Nose: nasal turbinates moist, no nasal discharge    Throat: moist mucus membranes, no pharyngeal erythema, no tonsillar exudate. Airway is patent Psych: Anxious mood, tearful at times  Assessment and Plan:  1.  Generalized Anxiety: Uncontrolled. Did not tolerate Lexapro due to GI symptoms and only took for 1 day. Continued to take Xanax. Was taking TID sometimes instead of BID and only has 2 pills left. Previously advised to see psychiatrist but patient refused to go.  - Refilled Ativan 2 mg BID PRN #20 pills as I do not want patient to abruptly withdraw from benzos - Discussed that patient can only take Ativan BID and if she runs out of pills early we will not be able to refill her medication - Stop Lexapro - Start Celexa 20 mg daily - Follow up with PCP in 1 week   2. Chronic Sinusitis: No signs of infection. Afebrile.  - Start Flonase -  May consider adding an antihistamine to medication regimen if symptoms not improved - Will hold off on ENT referral for now as patient states she does not have the funds to see them  3. Eustachian Tube Dysfunction:  - Flonase as above  Anders Simmonds, MD [facility], PGY-2

## 2017-05-21 NOTE — Patient Instructions (Addendum)
  Thank you for coming in today, it was so nice to see you! Today we talked about:    Anxiety: Stop Lexapro. We have started you on a new medication (Celexa). Please take 1 pill of Celexa daily. Continue taking Ativan ONLY TWO PILLS PER DAY.   Use flonase nasal spray, this will help you with the nasal congestion and ear pain  Please follow up in 7 days with [place] [place]. You can schedule this appointment at the front desk before you leave or call the clinic.  Bring in all your medications or supplements to each appointment for review.    Sincerely,  Anders Simmondshristina Gambino, MD    IF you received an x-ray today, you will receive an invoice from [facility]. Please contact [facility] at [phone] with questions or concerns regarding your invoice.   IF you received labwork today, you will receive an invoice from [facility]. Please contact [facility] at [phone] with questions or concerns regarding your invoice.   Our billing staff will not be able to assist you with questions regarding bills from these companies.  You will be contacted with the lab results as soon as they are available. The fastest way to get your results is to activate your My Chart account. Instructions are located on the last page of this paperwork. If you have not heard from us regarding the results in 2 weeks, please contact this office.

## 2017-05-28 ENCOUNTER — Ambulatory Visit (INDEPENDENT_AMBULATORY_CARE_PROVIDER_SITE_OTHER): Payer: Self-pay | Admitting: Physician Assistant

## 2017-05-28 ENCOUNTER — Encounter: Payer: Self-pay | Admitting: Physician Assistant

## 2017-05-28 VITALS — BP 146/79 | HR 101 | Temp 98.4°F | Resp 18 | Ht 64.25 in | Wt 136.0 lb

## 2017-05-28 DIAGNOSIS — F419 Anxiety disorder, unspecified: Secondary | ICD-10-CM

## 2017-05-28 MED ORDER — DULOXETINE HCL 30 MG PO CPEP: 30.0000 mg | ORAL_CAPSULE | Freq: Every day | ORAL | 1 refills | Status: DC

## 2017-05-28 MED ORDER — LORAZEPAM 2 MG PO TABS: 2.0000 mg | ORAL_TABLET | Freq: Two times a day (BID) | ORAL | 0 refills | Status: DC | PRN

## 2017-05-28 NOTE — Patient Instructions (Addendum)
I am referring you to a psychiatrist.  Please make sure that you get to this appointment.   We will try the cymbalta.  Return in 4-6 weeks.  Unless you get to the psychiatry appointment first.    Generalized Anxiety Disorder, Adult Generalized anxiety disorder (GAD) is a mental health disorder. People with this condition constantly worry about everyday events. Unlike normal anxiety, worry related to GAD is not triggered by a specific event. These worries also do not fade or get better with time. GAD interferes with life functions, including relationships, work, and school. GAD can vary from mild to severe. People with severe GAD can have intense waves of anxiety with physical symptoms (panic attacks). What are the causes? The exact cause of GAD is not known. What increases the risk? This condition is more likely to develop in:  Women.  People who have a family history of anxiety disorders.  People who are very shy.  People who experience very stressful life events, such as the death of a loved one.  People who have a very stressful family environment.  What are the signs or symptoms? People with GAD often worry excessively about many things in their lives, such as their health and family. They may also be overly concerned about:  Doing well at work.  Being on time.  Natural disasters.  Friendships.  Physical symptoms of GAD include:  Fatigue.  Muscle tension or having muscle twitches.  Trembling or feeling shaky.  Being easily startled.  Feeling like your heart is pounding or racing.  Feeling out of breath or like you cannot take a deep breath.  Having trouble falling asleep or staying asleep.  Sweating.  Nausea, diarrhea, or irritable bowel syndrome (IBS).  Headaches.  Trouble concentrating or remembering facts.  Restlessness.  Irritability.  How is this diagnosed? Your health care provider can diagnose GAD based on your symptoms and medical history. You  will also have a physical exam. The health care provider will ask specific questions about your symptoms, including how severe they are, when they started, and if they come and go. Your health care provider may ask you about your use of alcohol or drugs, including prescription medicines. Your health care provider may refer you to a mental health specialist for further evaluation. Your health care provider will do a thorough examination and may perform additional tests to rule out other possible causes of your symptoms. To be diagnosed with GAD, a person must have anxiety that:  Is out of his or her control.  Affects several different aspects of his or her life, such as work and relationships.  Causes distress that makes him or her unable to take part in normal activities.  Includes at least three physical symptoms of GAD, such as restlessness, fatigue, trouble concentrating, irritability, muscle tension, or sleep problems.  Before your health care provider can confirm a diagnosis of GAD, these symptoms must be present more days than they are not, and they must last for six months or longer. How is this treated? The following therapies are usually used to treat GAD:  Medicine. Antidepressant medicine is usually prescribed for long-term daily control. Antianxiety medicines may be added in severe cases, especially when panic attacks occur.  Talk therapy (psychotherapy). Certain types of talk therapy can be helpful in treating GAD by providing support, education, and guidance. Options include: ? Cognitive behavioral therapy (CBT). People learn coping skills and techniques to ease their anxiety. They learn to identify unrealistic or negative  thoughts and behaviors and to replace them with positive ones. ? Acceptance and commitment therapy (ACT). This treatment teaches people how to be mindful as a way to cope with unwanted thoughts and feelings. ? Biofeedback. This process trains you to manage your  body's response (physiological response) through breathing techniques and relaxation methods. You will work with a therapist while machines are used to monitor your physical symptoms.  Stress management techniques. These include yoga, meditation, and exercise.  A mental health specialist can help determine which treatment is best for you. Some people see improvement with one type of therapy. However, other people require a combination of therapies. Follow these instructions at home:  Take over-the-counter and prescription medicines only as told by your health care provider.  Try to maintain a normal routine.  Try to anticipate stressful situations and allow extra time to manage them.  Practice any stress management or self-calming techniques as taught by your health care provider.  Do not punish yourself for setbacks or for not making progress.  Try to recognize your accomplishments, even if they are small.  Keep all follow-up visits as told by your health care provider. This is important. Contact a health care provider if:  Your symptoms do not get better.  Your symptoms get worse.  You have signs of depression, such as: ? A persistently sad, cranky, or irritable mood. ? Loss of enjoyment in activities that used to bring you joy. ? Change in weight or eating. ? Changes in sleeping habits. ? Avoiding friends or family members. ? Loss of energy for normal tasks. ? Feelings of guilt or worthlessness. Get help right away if:  You have serious thoughts about hurting yourself or others. If you ever feel like you may hurt yourself or others, or have thoughts about taking your own life, get help right away. You can go to your nearest emergency department or call:  Your local emergency services (911 in the [place]  A suicide crisis helpline, such as the National Suicide Prevention Lifeline at (641)541-70741-503 799 6729. This is open 24 hours a day.  Summary  Generalized anxiety disorder (GAD)  is a mental health disorder that involves worry that is not triggered by a specific event.  People with GAD often worry excessively about many things in their lives, such as their health and family.  GAD may cause physical symptoms such as restlessness, trouble concentrating, sleep problems, frequent sweating, nausea, diarrhea, headaches, and trembling or muscle twitching.  A mental health specialist can help determine which treatment is best for you. Some people see improvement with one type of therapy. However, other people require a combination of therapies. This information is not intended to replace advice given to you by your health care provider. Make sure you discuss any questions you have with your health care provider. Document Released: 03/29/2013 Document Revised: 10/22/2016 Document Reviewed: 10/22/2016 Elsevier Interactive Patient Education  [employer]2018 [employer]

## 2017-05-29 NOTE — Progress Notes (Signed)
[facility] [address], [place] [address] 336 [phone]  Date:  05/28/2017   Name:  Loretta AllegraBiljana Sligh   DOB:  07-30-1956   MRN:  [id number]  PCP:  Garnetta BuddyEnglish, Stephanie D, PA    History of Present Illness:  Loretta Copeland is a 61 y.o. female patient who presents to PCP with  Chief Complaint  Patient presents with  . Pain in stomach    after taking escitalopram    Translator utilized at this visit. Translator states there is difficulty understanding her, and she repeatedly talks over the interpreter, while talking to me as well as delineating my information to her.  She will then say, "i don't understand". Patient states that the lexapro gives her abdominal pain, and she would like a refill of her ativan.   To note, she has been tapered from ativan q6 hours to q12 hours after no known hx of panic attacks, and need for high dosage.  This was last tapered to q12 hours 04/30/2017.  She returned 05/21/2017, after running out of her medication.  She was seen by Dr. Tawnya CrookGambino/Dr. Walden, and given refill for 10 days at the q12 hour dose.  Also started on celexa.  She was taking 3 per day.   She returns today for medication refill.  This would be day 7.  She can not state that she started the celexa, but has the lexapro container with her which is practically full.   The abdominal pain has resolved since she discontinued the medication.    Patient Active Problem List   Diagnosis Date Noted  . Sinusitis, chronic 09/07/2016  . Smoking 11/06/2015    Past Medical History:  Diagnosis Date  . Arthritis   . Depression   . Migraines     History reviewed. No pertinent surgical history.  Social History  Substance Use Topics  . Smoking status: Current Some Day Smoker    Packs/day: 1.00    Years: 30.00    Types: Cigarettes  . Smokeless tobacco: Never Used     Comment: not smoking every day  . Alcohol use No    Family History  Problem Relation Age of Onset  . Diabetes Mother   .  Diabetes Father   . Diabetes Brother   . Diabetes Daughter     Allergies  Allergen Reactions  . Penicillins   . Tetracyclines & Related     Medication list has been reviewed and updated.  Current Outpatient Prescriptions on File Prior to Visit  Medication Sig Dispense Refill  . albuterol (PROVENTIL HFA;VENTOLIN HFA) 108 (90 BASE) MCG/ACT inhaler Inhale 1-2 puffs into the lungs every 6 (six) hours as needed for wheezing or shortness of breath. 1 Inhaler 0  . chlorpheniramine-HYDROcodone (TUSSIONEX PENNKINETIC ER) 10-8 MG/5ML SUER Take 5 mLs by mouth every 12 (twelve) hours as needed for cough. 60 mL 0  . citalopram (CELEXA) 20 MG tablet Take 1 tablet (20 mg total) by mouth daily. 30 tablet 3  . diclofenac sodium (VOLTAREN) 1 % GEL Apply 2 g topically 4 (four) times daily. 100 g 2  . fluticasone (FLONASE) 50 MCG/ACT nasal spray Place 2 sprays into both nostrils daily. 16 g 6  . nicotine (NICODERM CQ) 14 mg/24hr patch Place 1 patch (14 mg total) onto the skin daily. Use for three months total. 28 patch 0  . rivaroxaban (XARELTO) 10 MG TABS tablet Take 1 tablet (10 mg total) by mouth daily. 45 tablet 0   No current facility-administered  medications on file prior to visit.     ROS ROS otherwise unremarkable unless listed above.  Physical Examination: BP (!) 146/79   Pulse (!) 101   Temp 98.4 F (36.9 C) (Oral)   Resp 18   Ht 5' 4.25" (1.632 m)   Wt 136 lb (61.7 kg)   SpO2 95%   BMI 23.16 kg/m  Ideal Body Weight: Weight in (lb) to have BMI = 25: 146.5  Physical Exam  Constitutional: She is oriented to person, place, and time. She appears well-developed and well-nourished. No distress.  HENT:  Head: Normocephalic and atraumatic.  Right Ear: External ear normal.  Left Ear: External ear normal.  Eyes: Conjunctivae and EOM are normal. Pupils are equal, round, and reactive to light.  Cardiovascular: Normal rate.   Pulmonary/Chest: Effort normal. No respiratory distress.   Neurological: She is alert and oriented to person, place, and time.  Skin: She is not diaphoretic.  Psychiatric: She has a normal mood and affect. Her behavior is normal.    Assessment and Plan: Jacquese Cassarino is a 61 y.o. female who is here today for cc of medication refill She is not tachycardic with auscultation. I will refill ativan in 3 days.  Advised to start Cymbalta. She turns in escitalopram. Referral to psychiatry is given.  Anxiety - Plan: Ambulatory referral to Psychiatry, DISCONTINUED: LORazepam (ATIVAN) 2 MG tablet  [name], PA-C [facility] 6/18/20188:35 AM

## 2017-05-30 ENCOUNTER — Encounter: Payer: Self-pay | Admitting: Physician Assistant

## 2017-05-30 ENCOUNTER — Ambulatory Visit (INDEPENDENT_AMBULATORY_CARE_PROVIDER_SITE_OTHER): Payer: Self-pay | Admitting: Physician Assistant

## 2017-05-30 VITALS — BP 117/77 | HR 103 | Resp 18 | Ht 64.25 in | Wt 136.0 lb

## 2017-05-30 DIAGNOSIS — F419 Anxiety disorder, unspecified: Secondary | ICD-10-CM

## 2017-05-30 MED ORDER — LORAZEPAM 2 MG PO TABS: 2.0000 mg | ORAL_TABLET | Freq: Two times a day (BID) | ORAL | 0 refills | Status: DC | PRN

## 2017-05-30 MED ORDER — HYDROXYZINE HCL 25 MG PO TABS: 25.0000 mg | ORAL_TABLET | Freq: Three times a day (TID) | ORAL | 0 refills | Status: DC | PRN

## 2017-05-30 NOTE — Patient Instructions (Signed)
     IF you received an x-ray today, you will receive an invoice from [facility]. Please contact [facility] at [phone] with questions or concerns regarding your invoice.   IF you received labwork today, you will receive an invoice from [facility]. Please contact [facility] at [phone] with questions or concerns regarding your invoice.   Our billing staff will not be able to assist you with questions regarding bills from these companies.  You will be contacted with the lab results as soon as they are available. The fastest way to get your results is to activate your My Chart account. Instructions are located on the last page of this paperwork. If you have not heard from us regarding the results in 2 weeks, please contact this office.     

## 2017-06-02 NOTE — Progress Notes (Signed)
[facility] [address], [place] [address] [phone]  Date:  05/30/2017   Name:  Loretta AllegraBiljana Copeland   DOB:  [birth year]   MRN:  [id number]  PCP:  Garnetta BuddyEnglish, Guy Toney D, PA    History of Present Illness:  Loretta Copeland is a 61 y.o. female patient who presents to PCP with  Chief Complaint  Patient presents with  . Medication Management  . Follow-up     --patient states that she would like to not have an intepreter today. --she reports that she has pain every where.  She would like a refill of her anxio-lytic today.  She states that she has a "christening" to go to for her grandson and will need it.  When asked why, she can not report this.  She reports pain in neck, and lack of sleep.  I ask what are her symptoms, but she persist with "i need my medication".  She denies dizziness.  No cp, palpitations, or sob.   --to note she has been tapering down from ativan 2mg  q6 hours.  I have decreased to 4 to 3 to 2.  q12 hours was given 04/30/2017.  She returned 3 weeks later, requesting refill.  She was taking 3 per day, she reports though she was given twice per day.  She was seen by resident Dr. Jonathon JordanGambino and Dr. Gwendolyn GrantWalden, who refilled this q12 hours 05/21/2017 for 10 days, until she returned to see me.  She then was given a refill q12 hours to fill in 3 days, 2 days ago, started on duloxetine, and psychiatry referral given.  She is requesting the refill today.    Patient Active Problem List   Diagnosis Date Noted  . Sinusitis, chronic 09/07/2016  . Smoking 11/06/2015    Past Medical History:  Diagnosis Date  . Arthritis   . Depression   . Migraines     History reviewed. No pertinent surgical history.  Social History  Substance Use Topics  . Smoking status: Current Some Day Smoker    Packs/day: 1.00    Years: 30.00    Types: Cigarettes  . Smokeless tobacco: Never Used     Comment: not smoking every day  . Alcohol use No    Family History  Problem Relation Age of Onset   . Diabetes Mother   . Diabetes Father   . Diabetes Brother   . Diabetes Daughter     Allergies  Allergen Reactions  . Penicillins   . Tetracyclines & Related     Medication list has been reviewed and updated.  Current Outpatient Prescriptions on File Prior to Visit  Medication Sig Dispense Refill  . albuterol (PROVENTIL HFA;VENTOLIN HFA) 108 (90 BASE) MCG/ACT inhaler Inhale 1-2 puffs into the lungs every 6 (six) hours as needed for wheezing or shortness of breath. 1 Inhaler 0  . chlorpheniramine-HYDROcodone (TUSSIONEX PENNKINETIC ER) 10-8 MG/5ML SUER Take 5 mLs by mouth every 12 (twelve) hours as needed for cough. 60 mL 0  . citalopram (CELEXA) 20 MG tablet Take 1 tablet (20 mg total) by mouth daily. 30 tablet 3  . diclofenac sodium (VOLTAREN) 1 % GEL Apply 2 g topically 4 (four) times daily. 100 g 2  . DULoxetine (CYMBALTA) 30 MG capsule Take 1 capsule (30 mg total) by mouth daily. 30 capsule 1  . fluticasone (FLONASE) 50 MCG/ACT nasal spray Place 2 sprays into both nostrils daily. 16 g 6  . nicotine (NICODERM CQ) 14 mg/24hr patch Place 1 patch (14 mg  total) onto the skin daily. Use for three months total. 28 patch 0  . rivaroxaban (XARELTO) 10 MG TABS tablet Take 1 tablet (10 mg total) by mouth daily. 45 tablet 0   No current facility-administered medications on file prior to visit.     ROS ROS otherwise unremarkable unless listed above.  Physical Examination: BP 117/77   Pulse (!) 103   Resp 18   Ht 5' 4.25" (1.632 m)   Wt 136 lb (61.7 kg)   SpO2 96%   BMI 23.16 kg/m  Ideal Body Weight: Weight in (lb) to have BMI = 25: 146.5  Physical Exam  Constitutional: She is oriented to person, place, and time. She appears well-developed and well-nourished. No distress.  Cardiovascular: Normal rate.   Pulmonary/Chest: Effort normal. No respiratory distress.  Neurological: She is alert and oriented to person, place, and time.  Skin: She is not diaphoretic.  Psychiatric: She  has a normal mood and affect. Her behavior is normal.     Assessment and Plan: [name] is a 61 y.o. female who is here today for cc of medication refill She can refill this tomorrow.  I have offered vistaril today.  She states she wants her medicine.   I have referred her to psychiatry at last visit. If she returns early, will possibly recommend tapering dose.    1. Anxiety - LORazepam (ATIVAN) 2 MG tablet; Take 1 tablet (2 mg total) by mouth every 12 (twelve) hours as needed for anxiety. Fill after 6.15.2018.  Dispense: 60 tablet; Refill: 0 - hydrOXYzine (ATARAX/VISTARIL) 25 MG tablet; Take 1-2 tablets (25-50 mg total) by mouth every 8 (eight) hours as needed for anxiety.  Dispense: 6 tablet; Refill: 0   [name], PA-C [facility] 06/02/2017 7:41 AM

## 2017-06-03 ENCOUNTER — Ambulatory Visit (HOSPITAL_COMMUNITY)
Admission: EM | Admit: 2017-06-03 | Discharge: 2017-06-03 | Disposition: A | Discharge status: Home / Self Care | Payer: Self-pay | Attending: Family Medicine | Admitting: Family Medicine

## 2017-06-03 ENCOUNTER — Encounter (HOSPITAL_COMMUNITY): Payer: Self-pay | Admitting: Emergency Medicine

## 2017-06-03 DIAGNOSIS — F419 Anxiety disorder, unspecified: Secondary | ICD-10-CM

## 2017-06-03 DIAGNOSIS — Z760 Encounter for issue of repeat prescription: Secondary | ICD-10-CM

## 2017-06-03 MED ORDER — LORAZEPAM 2 MG PO TABS: 2.0000 mg | ORAL_TABLET | Freq: Three times a day (TID) | ORAL | 0 refills | Status: AC | PRN

## 2017-06-03 NOTE — ED Provider Notes (Signed)
[facility]    CSN: [id number] Arrival date & time: 06/03/17  1238     History   Chief Complaint Chief Complaint  Patient presents with  . Medication Refill    HPI Loretta Copeland is a 61 y.o. female.   This is a 61 year old woman from [place] has been on Ativan 2 mg 3 times a day for over year. She is been evaluated and treated by [place] [facility] and they have been tapering her Ativan. She is very upset. She has tried hydroxyzine and Lexapro and become quite somnolent and nauseated with these other approaches. She would like to continue taking the Ativan. She does not feel comfortable going back again to [place].      Past Medical History:  Diagnosis Date  . Arthritis   . Depression   . Migraines     Patient Active Problem List   Diagnosis Date Noted  . Sinusitis, chronic 09/07/2016  . Smoking 11/06/2015    History reviewed. No pertinent surgical history.  OB History    No data available       Home Medications    Prior to Admission medications   Medication Sig Start Date End Date Taking? Authorizing Provider  albuterol (PROVENTIL HFA;VENTOLIN HFA) 108 (90 BASE) MCG/ACT inhaler Inhale 1-2 puffs into the lungs every 6 (six) hours as needed for wheezing or shortness of breath. 11/06/15   [name], MD  diclofenac sodium (VOLTAREN) 1 % GEL Apply 2 g topically 4 (four) times daily. 05/04/16   [name], MD  fluticasone (FLONASE) 50 MCG/ACT nasal spray Place 2 sprays into both nostrils daily. 05/21/17   Beaulah Dinning, MD  LORazepam (ATIVAN) 2 MG tablet Take 1 tablet (2 mg total) by mouth every 8 (eight) hours as needed for anxiety. Fill after 6.15.2018. 06/03/17 07/03/17  [name], MD  nicotine (NICODERM CQ) 14 mg/24hr patch Place 1 patch (14 mg total) onto the skin daily. Use for three months total. 12/05/15   Ofilia Neas, PA-C  rivaroxaban (XARELTO) 10 MG TABS tablet Take 1 tablet (10 mg total) by mouth daily.  12/07/16   Eyvonne Mechanic, PA-C    Family History Family History  Problem Relation Age of Onset  . Diabetes Mother   . Diabetes Father   . Diabetes Brother   . Diabetes Daughter     Social History Social History  Substance Use Topics  . Smoking status: Current Some Day Smoker    Packs/day: 1.00    Years: 30.00    Types: Cigarettes  . Smokeless tobacco: Never Used     Comment: not smoking every day  . Alcohol use No     Allergies   Penicillins and Tetracyclines & related   Review of Systems Review of Systems   Physical Exam Triage Vital Signs ED Triage Vitals  Enc Vitals Group     BP 06/03/17 1253 133/75     Pulse Rate 06/03/17 1253 96     Resp 06/03/17 1253 18     Temp 06/03/17 1253 98.5 F (36.9 C)     Temp Source 06/03/17 1253 Oral     SpO2 06/03/17 1253 98 %     Weight --      Height --      Head Circumference --      Peak Flow --      Pain Score 06/03/17 1251 0     Pain Loc --      Pain Edu? --  Excl. in GC? --    No data found.   Updated Vital Signs BP 133/75 (BP Location: Right Arm)   Pulse 96   Temp 98.5 F (36.9 C) (Oral)   Resp 18   SpO2 98%    Physical Exam  Constitutional: She is oriented to person, place, and time. She appears well-developed and well-nourished.  HENT:  Head: Normocephalic and atraumatic.  Right Ear: External ear normal.  Left Ear: External ear normal.  Eyes: Conjunctivae are normal. Pupils are equal, round, and reactive to light.  Neck: Normal range of motion.  Pulmonary/Chest: Effort normal.  Musculoskeletal: Normal range of motion.  Neurological: She is alert and oriented to person, place, and time.  Skin: Skin is warm and dry.  Psychiatric:  Patient is tearful and frantic. I pointed out that we cannot refill her medicines on an ongoing basis here  Nursing note and vitals reviewed.    UC Treatments / Results  Labs (all labs ordered are listed, but only abnormal results are displayed) Labs  Reviewed - No data to display  EKG  EKG Interpretation None       Radiology No results found.  Procedures Procedures (including critical care time)  Medications Ordered in UC Medications - No data to display   Initial Impression / Assessment and Plan / UC Course  I have reviewed the triage vital signs and the nursing notes.  Pertinent labs & imaging results that were available during my care of the patient were reviewed by me and considered in my medical decision making (see chart for details).      Final Clinical Impressions(s) / UC Diagnoses   Final diagnoses:  Anxiety  Medication refill    New Prescriptions Current Discharge Medication List       Elvina SidleLauenstein, Khianna Blazina, MD 06/03/17 1320

## 2017-06-03 NOTE — Discharge Instructions (Signed)
You must go to a different clinic to get your anxiety under control and medications prescribed.

## 2017-06-03 NOTE — ED Triage Notes (Signed)
[facility] a [facility] ear pain and sinus pressure.

## 2017-07-17 ENCOUNTER — Encounter (HOSPITAL_COMMUNITY): Payer: Self-pay | Admitting: Emergency Medicine

## 2017-07-17 ENCOUNTER — Ambulatory Visit (HOSPITAL_COMMUNITY)
Admission: EM | Admit: 2017-07-17 | Discharge: 2017-07-17 | Disposition: A | Discharge status: Home / Self Care | Payer: Self-pay | Attending: Family Medicine | Admitting: Family Medicine

## 2017-07-17 DIAGNOSIS — F419 Anxiety disorder, unspecified: Secondary | ICD-10-CM

## 2017-07-17 MED ORDER — LORAZEPAM 2 MG PO TABS: 2.0000 mg | ORAL_TABLET | Freq: Three times a day (TID) | ORAL | 0 refills | Status: DC | PRN

## 2017-07-17 NOTE — ED Triage Notes (Signed)
PT was prescribed 2mg  lorazepam 1 tablet every 8 hours. PT is out. PT was told to come here for refill

## 2017-07-17 NOTE — ED Provider Notes (Signed)
  [facility]   [id number] 07/17/17 Arrival Time: 1315  ASSESSMENT & PLAN:  1. Anxiety     Meds ordered this encounter  Medications  . LORazepam (ATIVAN) 2 MG tablet    Sig: Take 1 tablet (2 mg total) by mouth every 8 (eight) hours as needed for anxiety.    Dispense:  42 tablet    Refill:  0   Informed that we will not be able to refill further prescriptions for lorazepam. She voices understanding and reports that she has an appointment with a PCP on 07/31/2017.  Reviewed expectations re: course of current medical issues. Questions answered. Outlined signs and symptoms indicating need for more acute intervention. Patient verbalized understanding. After Visit Summary given.   SUBJECTIVE:  Loretta Copeland is a 61 y.o. female who presents requesting refill of lorazepam. Takes 2mg  TID and has done so for over a year. Anxiety controlled. Last dose this morning. Does have PCP. No recent anxiety exacerbations.  ROS: As per HPI.   OBJECTIVE:  Vitals:   07/17/17 1328  BP: (!) 143/94  Pulse: (!) 108  Resp: 16  Temp: 97.6 F (36.4 C)  TempSrc: Oral  SpO2: 96%  Weight: 136 lb (61.7 kg)    General appearance: alert; no distress Heart: regular rate and rhythm Psychological:  alert and cooperative; normal mood and affect   Allergies  Allergen Reactions  . Penicillins   . Tetracyclines & Related     PMHx, SurgHx, SocialHx, Medications, and Allergies were reviewed in the Visit Navigator and updated as appropriate.      Mardella LaymanHagler, Sidni Fusco, MD 07/17/17 1344

## 2017-07-17 NOTE — Discharge Instructions (Signed)
For further refills of your anxiety medicine you will need to see your primary care physician.

## 2017-07-30 ENCOUNTER — Ambulatory Visit (HOSPITAL_COMMUNITY)
Admission: EM | Admit: 2017-07-30 | Discharge: 2017-07-30 | Disposition: A | Discharge status: Home / Self Care | Payer: Self-pay | Attending: Family Medicine | Admitting: Family Medicine

## 2017-07-30 ENCOUNTER — Telehealth (HOSPITAL_COMMUNITY): Payer: Self-pay | Admitting: Emergency Medicine

## 2017-07-30 ENCOUNTER — Encounter (HOSPITAL_COMMUNITY): Payer: Self-pay | Admitting: Emergency Medicine

## 2017-07-30 DIAGNOSIS — F411 Generalized anxiety disorder: Secondary | ICD-10-CM

## 2017-07-30 DIAGNOSIS — H9202 Otalgia, left ear: Secondary | ICD-10-CM

## 2017-07-30 MED ORDER — TOBRAMYCIN 0.3 % OP SOLN: 1.0000 [drp] | Freq: Four times a day (QID) | OPHTHALMIC | 0 refills | Status: DC

## 2017-07-30 MED ORDER — LORAZEPAM 2 MG PO TABS: 2.0000 mg | ORAL_TABLET | Freq: Three times a day (TID) | ORAL | 3 refills | Status: AC | PRN

## 2017-07-30 MED ORDER — SULFAMETHOXAZOLE-TRIMETHOPRIM 800-160 MG PO TABS: 1.0000 | ORAL_TABLET | Freq: Two times a day (BID) | ORAL | 0 refills | Status: DC

## 2017-07-30 MED ORDER — SULFAMETHOXAZOLE-TRIMETHOPRIM 800-160 MG PO TABS: 1.0000 | ORAL_TABLET | Freq: Two times a day (BID) | ORAL | 0 refills | Status: AC

## 2017-07-30 NOTE — ED Triage Notes (Signed)
PT reports left ear pain for a "long time"

## 2017-07-30 NOTE — Discharge Instructions (Signed)
[facility] need to [facility] in the future because I am retiring and you need to find another doctor.

## 2017-07-30 NOTE — Telephone Encounter (Signed)
Pt in office... W[place] [facility] [place]  Re-sent meds

## 2017-07-30 NOTE — ED Provider Notes (Signed)
[facility]    CSN: [id number] Arrival date & time: 07/30/17  1207     History   Chief Complaint Chief Complaint  Patient presents with  . Otalgia    HPI Loretta Copeland is a 61 y.o. female.   This 61 year old woman who presents for evaluation of chronic left ear pain.  She has seen some resolution with Bactrim in the past  She also wants a refill on her Lorazepam.      Past Medical History:  Diagnosis Date  . Arthritis   . Depression   . Migraines     Patient Active Problem List   Diagnosis Date Noted  . Sinusitis, chronic 09/07/2016  . Smoking 11/06/2015    History reviewed. No pertinent surgical history.  OB History    No data available       Home Medications    Prior to Admission medications   Medication Sig Start Date End Date Taking? Authorizing Provider  albuterol (PROVENTIL HFA;VENTOLIN HFA) 108 (90 BASE) MCG/ACT inhaler Inhale 1-2 puffs into the lungs every 6 (six) hours as needed for wheezing or shortness of breath. 11/06/15   Elvina SidleLauenstein, Raphel Stickles, MD  diclofenac sodium (VOLTAREN) 1 % GEL Apply 2 g topically 4 (four) times daily. 05/04/16   Elvina SidleLauenstein, Jaimie Pippins, MD  fluticasone (FLONASE) 50 MCG/ACT nasal spray Place 2 sprays into both nostrils daily. 05/21/17   Beaulah DinningGambino, Christina M, MD  LORazepam (ATIVAN) 2 MG tablet Take 1 tablet (2 mg total) by mouth every 8 (eight) hours as needed for anxiety. 07/30/17 08/13/17  Elvina SidleLauenstein, Caldwell Kronenberger, MD  nicotine (NICODERM CQ) 14 mg/24hr patch Place 1 patch (14 mg total) onto the skin daily. Use for three months total. 12/05/15   Ofilia Neaslark, Michael L, PA-C  rivaroxaban (XARELTO) 10 MG TABS tablet Take 1 tablet (10 mg total) by mouth daily. 12/07/16   Hedges, Tinnie GensJeffrey, PA-C  sulfamethoxazole-trimethoprim (BACTRIM DS,SEPTRA DS) 800-160 MG tablet Take 1 tablet by mouth 2 (two) times daily. 07/30/17 08/06/17  Elvina SidleLauenstein, Rosalinda Seaman, MD  tobramycin (TOBREX) 0.3 % ophthalmic solution Place 1 drop into the left ear every 6 (six) hours.  07/30/17   Elvina SidleLauenstein, Hondo Nanda, MD    Family History Family History  Problem Relation Age of Onset  . Diabetes Mother   . Diabetes Father   . Diabetes Brother   . Diabetes Daughter     Social History Social History  Substance Use Topics  . Smoking status: Current Some Day Smoker    Packs/day: 1.00    Years: 30.00    Types: Cigarettes  . Smokeless tobacco: Never Used     Comment: not smoking every day  . Alcohol use No     Allergies   Penicillins and Tetracyclines & related   Review of Systems Review of Systems  HENT: Positive for ear pain.   Psychiatric/Behavioral: Positive for agitation.     Physical Exam Triage Vital Signs ED Triage Vitals [07/30/17 1246]  Enc Vitals Group     BP 135/79     Pulse Rate 97     Resp 16     Temp 98.2 F (36.8 C)     Temp Source Oral     SpO2 96 %     Weight      Height      Head Circumference      Peak Flow      Pain Score      Pain Loc      Pain Edu?      Excl.  in GC?    No data found.   Updated Vital Signs BP 135/79   Pulse 97   Temp 98.2 F (36.8 C) (Oral)   Resp 16   SpO2 96%    Physical Exam  Constitutional: She is oriented to person, place, and time. She appears well-developed and well-nourished.  HENT:  Right Ear: External ear normal.  Left Ear: External ear normal.  Mouth/Throat: Oropharynx is clear and moist.  Left TM retracted.  Eyes: Pupils are equal, round, and reactive to light. Conjunctivae are normal.  Neck: Normal range of motion. Neck supple.  Cardiovascular: Normal rate and regular rhythm.   Pulmonary/Chest: Effort normal and breath sounds normal.  Musculoskeletal: Normal range of motion.  Neurological: She is alert and oriented to person, place, and time.  Skin: Skin is warm and dry.  Nursing note and vitals reviewed.    UC Treatments / Results  Labs (all labs ordered are listed, but only abnormal results are displayed) Labs Reviewed - No data to display  EKG  EKG  Interpretation None       Radiology No results found.  Procedures Procedures (including critical care time)  Medications Ordered in UC Medications - No data to display   Initial Impression / Assessment and Plan / UC Course  I have reviewed the triage vital signs and the nursing notes.  Pertinent labs & imaging results that were available during my care of the patient were reviewed by me and considered in my medical decision making (see chart for details).     Final Clinical Impressions(s) / UC Diagnoses   Final diagnoses:  Left ear pain  Generalized anxiety disorder    New Prescriptions New Prescriptions   SULFAMETHOXAZOLE-TRIMETHOPRIM (BACTRIM DS,SEPTRA DS) 800-160 MG TABLET    Take 1 tablet by mouth 2 (two) times daily.   TOBRAMYCIN (TOBREX) 0.3 % OPHTHALMIC SOLUTION    Place 1 drop into the left ear every 6 (six) hours.     Controlled Substance Prescriptions [place] Controlled Substance Registry consulted? No   [name], MD 07/30/17 1317

## 2017-11-12 ENCOUNTER — Other Ambulatory Visit: Payer: Self-pay

## 2017-11-12 ENCOUNTER — Ambulatory Visit (HOSPITAL_COMMUNITY)
Admission: EM | Admit: 2017-11-12 | Discharge: 2017-11-12 | Disposition: A | Discharge status: Home / Self Care | Payer: Self-pay | Attending: Family Medicine | Admitting: Family Medicine

## 2017-11-12 DIAGNOSIS — J321 Chronic frontal sinusitis: Secondary | ICD-10-CM

## 2017-11-12 MED ORDER — AZITHROMYCIN 250 MG PO TABS: ORAL_TABLET | ORAL | 0 refills | Status: DC

## 2017-11-12 NOTE — Discharge Instructions (Signed)
Tylenol and/or ibuprofen as needed for pain or fevers.  Push fluids to ensure adequate hydration and keep secretions thin.  Complete course of antibiotics. Please follow up with your primary care provider for your medication management.

## 2017-11-12 NOTE — ED Provider Notes (Signed)
[facility]    CSN: [id number] Arrival date & time: 11/12/17  1025     History   Chief Complaint Chief Complaint  Patient presents with  . Sore Throat    HPI Loretta Copeland is a 61 y.o. female.   Sabre presents with complaints of cough and congestion, worsening over the past two weeks. She states she has been decreasing her cough. Difficulty obtaining history from patient as she states repeatedly that she needs her ativan refilled from a provider she has seen in UC in the past. She states she needs it for christmas coming up. Denies seeing her PCP recently for medication management. States she also would like cough syrup today from clinic. Without shortness of breath, fevers. Without gi/gu complaints.    ROS per HPI.       Past Medical History:  Diagnosis Date  . Arthritis   . Depression   . Migraines     Patient Active Problem List   Diagnosis Date Noted  . Sinusitis, chronic 09/07/2016  . Smoking 11/06/2015    No past surgical history on file.  OB History    No data available       Home Medications    Prior to Admission medications   Medication Sig Start Date End Date Taking? Authorizing Provider  LORazepam (ATIVAN) 2 MG tablet Take 2 mg by mouth every 6 (six) hours as needed for anxiety.   Yes [provider]  albuterol (PROVENTIL HFA;VENTOLIN HFA) 108 (90 BASE) MCG/ACT inhaler Inhale 1-2 puffs into the lungs every 6 (six) hours as needed for wheezing or shortness of breath. 11/06/15   Elvina SidleLauenstein, Kurt, MD  azithromycin (ZITHROMAX) 250 MG tablet Take first 2 tablets together, then 1 every day until finished. 11/12/17   Linus MakoBurky, Brett Darko B, NP  diclofenac sodium (VOLTAREN) 1 % GEL Apply 2 g topically 4 (four) times daily. 05/04/16   Elvina SidleLauenstein, Kurt, MD  fluticasone (FLONASE) 50 MCG/ACT nasal spray Place 2 sprays into both nostrils daily. 05/21/17   Beaulah DinningGambino, Christina M, MD  nicotine (NICODERM CQ) 14 mg/24hr patch Place 1 patch (14 mg  total) onto the skin daily. Use for three months total. 12/05/15   Ofilia Neaslark, Michael L, PA-C  rivaroxaban (XARELTO) 10 MG TABS tablet Take 1 tablet (10 mg total) by mouth daily. 12/07/16   Hedges, Tinnie GensJeffrey, PA-C  tobramycin (TOBREX) 0.3 % ophthalmic solution Place 1 drop into the left ear every 6 (six) hours. 07/30/17   Elvina SidleLauenstein, Kurt, MD    Family History Family History  Problem Relation Age of Onset  . Diabetes Mother   . Diabetes Father   . Diabetes Brother   . Diabetes Daughter     Social History Social History   Tobacco Use  . Smoking status: Current Some Day Smoker    Packs/day: 1.00    Years: 30.00    Pack years: 30.00    Types: Cigarettes  . Smokeless tobacco: Never Used  . Tobacco comment: not smoking every day  Substance Use Topics  . Alcohol use: No    Alcohol/week: 0.0 oz  . Drug use: No     Allergies   Penicillins and Tetracyclines & related   Review of Systems Review of Systems   Physical Exam Triage Vital Signs ED Triage Vitals  Enc Vitals Group     BP 11/12/17 1047 (!) 156/89     Pulse Rate 11/12/17 1047 93     Resp 11/12/17 1047 18     Temp 11/12/17 1047  97.9 F (36.6 C)     Temp Source 11/12/17 1047 Oral     SpO2 11/12/17 1047 97 %     Weight --      Height --      Head Circumference --      Peak Flow --      Pain Score 11/12/17 1041 4     Pain Loc --      Pain Edu? --      Excl. in GC? --    No data found.  Updated Vital Signs BP (!) 156/89 (BP Location: Right Arm)   Pulse 93   Temp 97.9 F (36.6 C) (Oral)   Resp 18   SpO2 97%   Visual Acuity Right Eye Distance:   Left Eye Distance:   Bilateral Distance:    Right Eye Near:   Left Eye Near:    Bilateral Near:     Physical Exam  Constitutional: She is oriented to person, place, and time. She appears well-developed and well-nourished.  Non-toxic appearance. She does not appear ill. No distress.  HENT:  Right Ear: Tympanic membrane and ear canal normal.  Left Ear: Tympanic  membrane and ear canal normal.  Mouth/Throat: Uvula is midline, oropharynx is clear and moist and mucous membranes are normal. No tonsillar exudate.  Eyes: Pupils are equal, round, and reactive to light.  Cardiovascular: Normal rate, regular rhythm and normal heart sounds.  Pulmonary/Chest: Effort normal and breath sounds normal.  Lymphadenopathy:    She has no cervical adenopathy.  Neurological: She is alert and oriented to person, place, and time.  Skin: Skin is warm and dry.     UC Treatments / Results  Labs (all labs ordered are listed, but only abnormal results are displayed) Labs Reviewed - No data to display  EKG  EKG Interpretation None       Radiology No results found.  Procedures Procedures (including critical care time)  Medications Ordered in UC Medications - No data to display   Initial Impression / Assessment and Plan / UC Course  I have reviewed the triage vital signs and the nursing notes.  Pertinent labs & imaging results that were available during my care of the patient were reviewed by me and considered in my medical decision making (see chart for details).     Patient encouraged to follow up with primary care provider for medication management. May try azithromycin for cough symptoms. Supportive cares recommended. Return precautions provided.     Final Clinical Impressions(s) / UC Diagnoses   Final diagnoses:  Frontal sinusitis, unspecified chronicity    ED Discharge Orders        Ordered    azithromycin (ZITHROMAX) 250 MG tablet     11/12/17 1059       Controlled Substance Prescriptions [place] Controlled Substance Registry consulted? Not Applicable   Georgetta HaberBurky, Ulises Wolfinger B, NP 11/12/17 1106

## 2017-11-12 NOTE — ED Triage Notes (Signed)
Sore throat, congestions, started 2 weeks, hoarseness..../or

## 2017-11-20 ENCOUNTER — Other Ambulatory Visit: Payer: Self-pay

## 2017-11-20 ENCOUNTER — Encounter (HOSPITAL_COMMUNITY): Payer: Self-pay | Admitting: Emergency Medicine

## 2017-11-20 ENCOUNTER — Ambulatory Visit (HOSPITAL_COMMUNITY)
Admission: EM | Admit: 2017-11-20 | Discharge: 2017-11-20 | Disposition: A | Discharge status: Home / Self Care | Payer: Self-pay | Attending: Emergency Medicine | Admitting: Emergency Medicine

## 2017-11-20 DIAGNOSIS — I809 Phlebitis and thrombophlebitis of unspecified site: Secondary | ICD-10-CM

## 2017-11-20 DIAGNOSIS — J9801 Acute bronchospasm: Secondary | ICD-10-CM

## 2017-11-20 DIAGNOSIS — H66005 Acute suppurative otitis media without spontaneous rupture of ear drum, recurrent, left ear: Secondary | ICD-10-CM

## 2017-11-20 DIAGNOSIS — R059 Cough, unspecified: Secondary | ICD-10-CM

## 2017-11-20 DIAGNOSIS — J069 Acute upper respiratory infection, unspecified: Secondary | ICD-10-CM

## 2017-11-20 DIAGNOSIS — R0982 Postnasal drip: Secondary | ICD-10-CM

## 2017-11-20 DIAGNOSIS — R05 Cough: Secondary | ICD-10-CM

## 2017-11-20 HISTORY — DX: Phlebitis and thrombophlebitis of unspecified site: I80.9

## 2017-11-20 MED ORDER — AZITHROMYCIN 250 MG PO TABS: ORAL_TABLET | ORAL | 0 refills | Status: DC

## 2017-11-20 MED ORDER — PREDNISONE 50 MG PO TABS: ORAL_TABLET | ORAL | 0 refills | Status: DC

## 2017-11-20 MED ORDER — ALBUTEROL SULFATE HFA 108 (90 BASE) MCG/ACT IN AERS: 2.0000 | INHALATION_SPRAY | RESPIRATORY_TRACT | 0 refills | Status: DC | PRN

## 2017-11-20 NOTE — ED Triage Notes (Addendum)
Seen 11/28.  Patient is not feeling better.  Complaints of cough. Cough a lot at night.  Head pressure

## 2017-11-20 NOTE — Discharge Instructions (Signed)
Use the albuterol inhaler 2 puffs every 4 hours for cough and wheeze. Drink plenty of fluids. Take the medications as directed. The following medications can help with drainage in the back of your throat. Sudafed PE 10 mg every 4 to 6 hours as needed for congestion Allegra or Zyrtec daily as needed for drainage and runny nose. For stronger antihistamine may take Chlor-Trimeton 2 to 4 mg every 4 to 6 hours, may cause drowsiness.S Saline nasal spray used frequently. Drink plenty of fluids and stay well-hydrated.

## 2017-11-20 NOTE — ED Provider Notes (Signed)
[facility]    CSN: [id number] Arrival date & time: 11/20/17  1050     History   Chief Complaint Chief Complaint  Patient presents with  . Cough    HPI Loretta Copeland is a 61 y.o. female.   61 year old female with a several decades history of smoking complains of cough for 3 weeks. She also complains of left ear pressure and sore throat. She does not have HFA's at home. She continued to request an antibiotic. No known fevers.      Past Medical History:  Diagnosis Date  . Arthritis   . Depression   . Migraines   . Thrombophlebitis 11/2016    Patient Active Problem List   Diagnosis Date Noted  . Thrombophlebitis 11/15/2016  . Sinusitis, chronic 09/07/2016  . Smoking 11/06/2015    History reviewed. No pertinent surgical history.  OB History    No data available       Home Medications    Prior to Admission medications   Medication Sig Start Date End Date Taking? Authorizing Provider  albuterol (PROVENTIL HFA;VENTOLIN HFA) 108 (90 Base) MCG/ACT inhaler Inhale 2 puffs into the lungs every 4 (four) hours as needed for wheezing or shortness of breath. 11/20/17   Hayden Rasmussen, NP  azithromycin (ZITHROMAX) 250 MG tablet 2 tabs po on day one, then one tablet po once daily on days 2-5. 11/20/17   Hayden Rasmussen, NP  diclofenac sodium (VOLTAREN) 1 % GEL Apply 2 g topically 4 (four) times daily. 05/04/16   [name], MD  LORazepam (ATIVAN) 2 MG tablet Take 2 mg by mouth every 6 (six) hours as needed for anxiety.    [provider]  nicotine (NICODERM CQ) 14 mg/24hr patch Place 1 patch (14 mg total) onto the skin daily. Use for three months total. 12/05/15   Ofilia Neas, PA-C  predniSONE (DELTASONE) 50 MG tablet 1 tab po daily for 6 days. Take with food. 11/20/17   Hayden Rasmussen, NP  rivaroxaban (XARELTO) 10 MG TABS tablet Take 1 tablet (10 mg total) by mouth daily. 12/07/16   Eyvonne Mechanic, PA-C    Family History Family History  Problem  Relation Age of Onset  . Diabetes Mother   . Diabetes Father   . Diabetes Brother   . Diabetes Daughter     Social History Social History   Tobacco Use  . Smoking status: Current Some Day Smoker    Packs/day: 1.00    Years: 30.00    Pack years: 30.00    Types: Cigarettes  . Smokeless tobacco: Never Used  . Tobacco comment: not smoking every day  Substance Use Topics  . Alcohol use: No    Alcohol/week: 0.0 oz  . Drug use: No     Allergies   Penicillins and Tetracyclines & related   Review of Systems Review of Systems  Constitutional: Negative.  Negative for fever.  HENT: Positive for ear pain and sore throat.   Respiratory: Positive for cough.   Neurological: Negative.   All other systems reviewed and are negative.    Physical Exam Triage Vital Signs ED Triage Vitals  Enc Vitals Group     BP 11/20/17 1114 (!) 145/79     Pulse Rate 11/20/17 1114 94     Resp 11/20/17 1114 (!) 22     Temp 11/20/17 1114 98.1 F (36.7 C)     Temp Source 11/20/17 1114 Oral     SpO2 11/20/17 1114 98 %  Weight --      Height --      Head Circumference --      Peak Flow --      Pain Score 11/20/17 1109 6     Pain Loc --      Pain Edu? --      Excl. in GC? --    No data found.  Updated Vital Signs BP (!) 145/79 (BP Location: Right Arm)   Pulse 94   Temp 98.1 F (36.7 C) (Oral)   Resp (!) 22   SpO2 98%   Visual Acuity Right Eye Distance:   Left Eye Distance:   Bilateral Distance:    Right Eye Near:   Left Eye Near:    Bilateral Near:     Physical Exam  Constitutional: She is oriented to person, place, and time. She appears well-developed and well-nourished. No distress.  HENT:  Head: Normocephalic and atraumatic.  Mouth/Throat: No oropharyngeal exudate.  Left TM with apparent effusion discolored with a portion of the eardrum erythematous. Right TM retracted.  Oropharynx with mild erythema and moderate amount of clear PND and cobblestoning.  Neck: Normal  range of motion. Neck supple.  Cardiovascular: Normal rate and regular rhythm.  Pulmonary/Chest: Effort normal and breath sounds normal. No respiratory distress.  Lungs with expiratory wheezes. No crackles.  Musculoskeletal: Normal range of motion. She exhibits no edema.  Lymphadenopathy:    She has no cervical adenopathy.  Neurological: She is alert and oriented to person, place, and time.  Skin: Skin is warm and dry. No rash noted.  Psychiatric: She has a normal mood and affect.  Nursing note and vitals reviewed.    UC Treatments / Results  Labs (all labs ordered are listed, but only abnormal results are displayed) Labs Reviewed - No data to display  EKG  EKG Interpretation None       Radiology No results found.  Procedures Procedures (including critical care time)  Medications Ordered in UC Medications - No data to display   Initial Impression / Assessment and Plan / UC Course  I have reviewed the triage vital signs and the nursing notes.  Pertinent labs & imaging results that were available during my care of the patient were reviewed by me and considered in my medical decision making (see chart for details).    Use the albuterol inhaler 2 puffs every 4 hours for cough and wheeze. Drink plenty of fluids. Take the medications as directed. The following medications can help with drainage in the back of your throat. Sudafed PE 10 mg every 4 to 6 hours as needed for congestion Allegra or Zyrtec daily as needed for drainage and runny nose. For stronger antihistamine may take Chlor-Trimeton 2 to 4 mg every 4 to 6 hours, may cause drowsiness.S Saline nasal spray used frequently. Drink plenty of fluids and stay well-hydrated. Stop smoking   Final Clinical Impressions(s) / UC Diagnoses   Final diagnoses:  Cough  Bronchospasm  Acute upper respiratory infection  PND (post-nasal drip)  Recurrent acute suppurative otitis media without spontaneous rupture of left  tympanic membrane    ED Discharge Orders        Ordered    albuterol (PROVENTIL HFA;VENTOLIN HFA) 108 (90 Base) MCG/ACT inhaler  Every 4 hours PRN     11/20/17 1142    predniSONE (DELTASONE) 50 MG tablet     11/20/17 1142    azithromycin (ZITHROMAX) 250 MG tablet     11/20/17 1142  Controlled Substance Prescriptions  Controlled Substance Registry consulted? Not Applicable   Hayden RasmussenMabe, Rayyan Burley, NP 11/20/17 1146

## 2017-11-27 ENCOUNTER — Ambulatory Visit (INDEPENDENT_AMBULATORY_CARE_PROVIDER_SITE_OTHER): Payer: Self-pay | Admitting: Physician Assistant

## 2017-11-27 ENCOUNTER — Other Ambulatory Visit: Payer: Self-pay

## 2017-11-27 VITALS — BP 138/80 | HR 100 | Temp 98.0°F | Resp 16 | Ht 64.0 in | Wt 137.0 lb

## 2017-11-27 DIAGNOSIS — R0989 Other specified symptoms and signs involving the circulatory and respiratory systems: Secondary | ICD-10-CM

## 2017-11-27 DIAGNOSIS — Z79899 Other long term (current) drug therapy: Secondary | ICD-10-CM

## 2017-11-27 NOTE — Progress Notes (Signed)
PRIMARY CARE AT [facility] [address] [place] [address] [phone]  Date:  11/27/2017   Name:  Loretta Copeland   DOB:  04/08/1956   MRN:  [id number]  PCP:  Garnetta Buddy, PA    History of Present Illness:  Loretta Copeland is a 61 y.o. female patient who presents to PCP with  Chief Complaint  Patient presents with  . Cough    x 1 wk  . Sinusitis    head pressure     Tele translator used during visit Patient is here to discuss her refill of her lorazepam.  I have prompted the tele translator to ask about this cough and sinus pressure that she stated to front staff and CMA as chief complaint.  However patient consistently states that she needs a reifll of her lorazepam.  She states that she is running out.  She takes this three times per day.   To note, I had taken over her care 8 months ago.  At the time, I had reviewed her paper charts, and previous notes, where she could not endorse high anxiety or panic attacks.  I had evaluated the need of the alprazolam at every 6 hours, and it was established that this dose was too high, and at most this may be necessary for insomnia.  I attempted to start her on a taper to get her down to once daily.  We decreased to every 12 hours, but after multiple returns with taking her medication sooner than the prescribed dose, thus running out, I advised her that we would taper her off and I could not give her this prescription.  I also during this time repeatedly and did refer her to a psychiatrist to take over the management of her benzodiazepine use and need-- a further re-evaluation.  she did not follow up with this appointment.   She returns now 6 months later for her refill.  She has continued her dose.  4 days after visiting me, she was given a refill, up to every 8 hours by her former pcp, at an urgent care.  This was continued for several months. During her visit, I had asked why she needs this prescription.  She does not report panic attacks  or anxiety through the translator.  Translator reports that she is unclear of her answers.   Regarding cough, She has no sob or dyspnea.  No fevers with the cough.  This is all I could gather with this questioning.    Patient Active Problem List   Diagnosis Date Noted  . Thrombophlebitis 11/15/2016  . Sinusitis, chronic 09/07/2016  . Smoking 11/06/2015    Past Medical History:  Diagnosis Date  . Arthritis   . Depression   . Migraines   . Thrombophlebitis 11/2016    No past surgical history on file.  Social History   Tobacco Use  . Smoking status: Current Some Day Smoker    Packs/day: 1.00    Years: 30.00    Pack years: 30.00    Types: Cigarettes  . Smokeless tobacco: Never Used  . Tobacco comment: not smoking every day  Substance Use Topics  . Alcohol use: No    Alcohol/week: 0.0 oz  . Drug use: No    Family History  Problem Relation Age of Onset  . Diabetes Mother   . Diabetes Father   . Diabetes Brother   . Diabetes Daughter     Allergies  Allergen Reactions  . Penicillins   .  Tetracyclines & Related     Medication list has been reviewed and updated.  Current Outpatient Medications on File Prior to Visit  Medication Sig Dispense Refill  . albuterol (PROVENTIL HFA;VENTOLIN HFA) 108 (90 Base) MCG/ACT inhaler Inhale 2 puffs into the lungs every 4 (four) hours as needed for wheezing or shortness of breath. 1 Inhaler 0  . diclofenac sodium (VOLTAREN) 1 % GEL Apply 2 g topically 4 (four) times daily. 100 g 2  . LORazepam (ATIVAN) 2 MG tablet Take 2 mg by mouth every 6 (six) hours as needed for anxiety.    . nicotine (NICODERM CQ) 14 mg/24hr patch Place 1 patch (14 mg total) onto the skin daily. Use for three months total. 28 patch 0  . predniSONE (DELTASONE) 50 MG tablet 1 tab po daily for 6 days. Take with food. 6 tablet 0  . rivaroxaban (XARELTO) 10 MG TABS tablet Take 1 tablet (10 mg total) by mouth daily. 45 tablet 0  . azithromycin (ZITHROMAX) 250 MG  tablet 2 tabs po on day one, then one tablet po once daily on days 2-5. (Patient not taking: Reported on 11/27/2017) 6 tablet 0   No current facility-administered medications on file prior to visit.     ROS ROS otherwise unremarkable unless listed above.  Physical Examination: BP 138/80   Pulse 100   Temp 98 F (36.7 C) (Oral)   Resp 16   Ht 5\' 4"  (1.626 m)   Wt 137 lb (62.1 kg)   SpO2 96%   BMI 23.52 kg/m  Ideal Body Weight: Weight in (lb) to have BMI = 25: 145.3  Physical Exam  Constitutional: She is oriented to person, place, and time. She appears well-developed and well-nourished. No distress.  HENT:  Head: Normocephalic and atraumatic.  Right Ear: Tympanic membrane, external ear and ear canal normal.  Left Ear: Tympanic membrane, external ear and ear canal normal.  Nose: Mucosal edema and rhinorrhea present. Right sinus exhibits no maxillary sinus tenderness and no frontal sinus tenderness. Left sinus exhibits no maxillary sinus tenderness and no frontal sinus tenderness.  Mouth/Throat: No uvula swelling. No oropharyngeal exudate, posterior oropharyngeal edema or posterior oropharyngeal erythema.  Eyes: Conjunctivae and EOM are normal. Pupils are equal, round, and reactive to light.  Cardiovascular: Normal rate and regular rhythm. Exam reveals no gallop, no distant heart sounds and no friction rub.  No murmur heard. Pulmonary/Chest: Effort normal. No respiratory distress. She has no decreased breath sounds. She has no wheezes. She has no rhonchi.  Lymphadenopathy:       Head (right side): No submandibular, no tonsillar, no preauricular and no posterior auricular adenopathy present.       Head (left side): No submandibular, no tonsillar, no preauricular and no posterior auricular adenopathy present.  Neurological: She is alert and oriented to person, place, and time.  Skin: She is not diaphoretic.  Psychiatric: She has a normal mood and affect. Her behavior is normal.      Assessment and Plan: Loretta Copeland is a 61 y.o. female who is here today for cc of  Chief Complaint  Patient presents with  . Cough    x 1 wk  . Sinusitis    head pressure   --discussed multiple times that she would need to follow up with the prescribing physician, as she has been for the last several months.  This was non-compliant and multiple providers which was initially discussed per controlled med policy.  I advised that I can refer her  to a psychiatrist.  This was offered.  She declined. Advised supportive treatment for UR symptoms.  Rtc as needed regarding UR symptoms.Marland Kitchen.  Upper respiratory symptom  Medication management  Trena PlattStephanie English, PA-C [facility] 12/30/20185:24 PM

## 2017-11-27 NOTE — Patient Instructions (Addendum)
  You can take mucinex 1200mg  every 12 hours. You may also do flonase. Please let me know if I can do anything else for the cough. You will have to pick up the lorazepam from your last prescribing provider.   IF you received an x-ray today, you will receive an [facility] from [facility]. Please contact [facility] at [phone] with questions or concerns regarding your invoice.   IF you received labwork today, you will receive an invoice from [facility]. Please contact [facility] at [phone] with questions or concerns regarding your invoice.   Our billing staff will not be able to assist you with questions regarding bills from these companies.  You will be contacted with the lab results as soon as they are available. The fastest way to get your results is to activate your My Chart account. Instructions are located on the last page of this paperwork. If you have not heard from us regarding the results in 2 weeks, please contact this office.

## 2017-11-28 ENCOUNTER — Ambulatory Visit (HOSPITAL_COMMUNITY)
Admission: EM | Admit: 2017-11-28 | Discharge: 2017-11-28 | Disposition: A | Discharge status: Home / Self Care | Payer: Self-pay | Attending: Physician Assistant | Admitting: Physician Assistant

## 2017-11-28 ENCOUNTER — Other Ambulatory Visit: Payer: Self-pay

## 2017-11-28 ENCOUNTER — Encounter (HOSPITAL_COMMUNITY): Payer: Self-pay | Admitting: Emergency Medicine

## 2017-11-28 DIAGNOSIS — F5101 Primary insomnia: Secondary | ICD-10-CM

## 2017-11-28 MED ORDER — LORAZEPAM 2 MG PO TABS: 2.0000 mg | ORAL_TABLET | Freq: Four times a day (QID) | ORAL | 0 refills | Status: DC | PRN

## 2017-11-28 NOTE — ED Provider Notes (Signed)
[facility]    CSN: [id number] Arrival date & time: 11/28/17  1013     History   Chief Complaint Chief Complaint  Patient presents with  . Medication Refill    HPI Loretta Copeland is a 61 y.o. female.   Who presents for further prescription for Ativan. She reports getting #90 here by Dr. Milus GlazierLauenstein and then again by Dr. Tracie HarrierHagler. She was in to see her PCP yesterday and reports she would not give it to her. She did not mention a reason why for this, but was told to come back here for a refill. She reports that she has trouble sleeping without it. Denies self harm to herself or others.       Past Medical History:  Diagnosis Date  . Arthritis   . Depression   . Migraines   . Thrombophlebitis 11/2016    Patient Active Problem List   Diagnosis Date Noted  . Thrombophlebitis 11/15/2016  . Sinusitis, chronic 09/07/2016  . Smoking 11/06/2015    History reviewed. No pertinent surgical history.  OB History    No data available       Home Medications    Prior to Admission medications   Medication Sig Start Date End Date Taking? Authorizing Provider  albuterol (PROVENTIL HFA;VENTOLIN HFA) 108 (90 Base) MCG/ACT inhaler Inhale 2 puffs into the lungs every 4 (four) hours as needed for wheezing or shortness of breath. 11/20/17   Hayden RasmussenMabe, David, NP  azithromycin (ZITHROMAX) 250 MG tablet 2 tabs po on day one, then one tablet po once daily on days 2-5. Patient not taking: Reported on 11/27/2017 11/20/17   Hayden RasmussenMabe, David, NP  diclofenac sodium (VOLTAREN) 1 % GEL Apply 2 g topically 4 (four) times daily. 05/04/16   Elvina SidleLauenstein, Kurt, MD  LORazepam (ATIVAN) 2 MG tablet Take 2 mg by mouth every 6 (six) hours as needed for anxiety.    [provider]  nicotine (NICODERM CQ) 14 mg/24hr patch Place 1 patch (14 mg total) onto the skin daily. Use for three months total. 12/05/15   Ofilia Neaslark, Michael L, PA-C  predniSONE (DELTASONE) 50 MG tablet 1 tab po daily for 6 days. Take with  food. 11/20/17   Hayden RasmussenMabe, David, NP  rivaroxaban (XARELTO) 10 MG TABS tablet Take 1 tablet (10 mg total) by mouth daily. 12/07/16   Eyvonne MechanicHedges, Jeffrey, PA-C    Family History Family History  Problem Relation Age of Onset  . Diabetes Mother   . Diabetes Father   . Diabetes Brother   . Diabetes Daughter     Social History Social History   Tobacco Use  . Smoking status: Current Some Day Smoker    Packs/day: 1.00    Years: 30.00    Pack years: 30.00    Types: Cigarettes  . Smokeless tobacco: Never Used  . Tobacco comment: not smoking every day  Substance Use Topics  . Alcohol use: No    Alcohol/week: 0.0 oz  . Drug use: No     Allergies   Penicillins and Tetracyclines & related   Review of Systems Review of Systems  Psychiatric/Behavioral: Positive for sleep disturbance. Negative for agitation, self-injury and suicidal ideas.     Physical Exam Triage Vital Signs ED Triage Vitals [11/28/17 1022]  Enc Vitals Group     BP (!) 125/93     Pulse Rate 96     Resp 18     Temp 97.8 F (36.6 C)     Temp src  SpO2 96 %     Weight      Height      Head Circumference      Peak Flow      Pain Score 0     Pain Loc      Pain Edu?      Excl. in GC?    No data found.  Updated Vital Signs BP (!) 125/93   Pulse 96   Temp 97.8 F (36.6 C)   Resp 18   SpO2 96%   Visual Acuity Right Eye Distance:   Left Eye Distance:   Bilateral Distance:    Right Eye Near:   Left Eye Near:    Bilateral Near:     Physical Exam  Constitutional: She is oriented to person, place, and time. She appears well-developed and well-nourished. No distress.  Neurological: She is alert and oriented to person, place, and time.  Skin: She is not diaphoretic.  Psychiatric: She has a normal mood and affect. Her behavior is normal. Thought content normal.  Nursing note and vitals reviewed.    UC Treatments / Results  Labs (all labs ordered are listed, but only abnormal results are  displayed) Labs Reviewed - No data to display  EKG  EKG Interpretation None       Radiology No results found.  Procedures Procedures (including critical care time)  Medications Ordered in UC Medications - No data to display   Initial Impression / Assessment and Plan / UC Course  I have reviewed the triage vital signs and the nursing notes.  Pertinent labs & imaging results that were available during my care of the patient were reviewed by me and considered in my medical decision making (see chart for details).    Insomnia with prior refills by 2 previous providers. She was given only #7 and given specific instructions to schedule an appt to discuss this only. We cannot continue to prescribe this as this is beyond our scope of practice as an urgent care. She appeared to understand.   ADDN: After patient left was able to further access the [facility] website. This showed a RX of Ativan #90 given 08/18 for 3 RF. This would make her OUT today. Again discussed the need for a PCP. She will discuss further with Dr. Milus GlazierLauenstein.  Final Clinical Impressions(s) / UC Diagnoses   Final diagnoses:  None    ED Discharge Orders    None       Controlled Substance Prescriptions [place] Controlled Substance Registry consulted? Yes, I have consulted the  [place] Controlled Substances Registry for this patient, and feel the risk/benefit ratio today is favorable for proceeding with this prescription for a controlled substance.   Riki SheerYoung, Kaidynce Pfister G, PA-C 11/28/17 1137

## 2017-11-28 NOTE — Discharge Instructions (Signed)
Please discuss your long term Ativan needs with your PCP, Dr. Lenox PondsEnglish. We are an Urgent Care facility and not a long term PCP. We cannot continue to prescribe this as we have no way to monitor you regularly. Please call and make an appt with your PCP to discuss long term management.

## 2017-11-28 NOTE — ED Triage Notes (Signed)
Pt wants a refill of ativan.

## 2017-12-01 ENCOUNTER — Ambulatory Visit (INDEPENDENT_AMBULATORY_CARE_PROVIDER_SITE_OTHER): Payer: Self-pay | Admitting: Physician Assistant

## 2017-12-01 VITALS — BP 153/82 | HR 94 | Temp 98.2°F | Resp 16 | Ht 64.0 in | Wt 137.0 lb

## 2017-12-01 DIAGNOSIS — Z79899 Other long term (current) drug therapy: Secondary | ICD-10-CM

## 2017-12-01 MED ORDER — LORAZEPAM 2 MG PO TABS
2.0000 mg | ORAL_TABLET | Freq: Two times a day (BID) | ORAL | 0 refills | Status: DC
Start: 2017-12-01 — End: 2019-12-02

## 2017-12-01 MED ORDER — LORAZEPAM 2 MG PO TABS: 2.0000 mg | ORAL_TABLET | Freq: Every day | ORAL | 0 refills | Status: DC

## 2017-12-01 MED ORDER — LORAZEPAM 2 MG PO TABS: 2.0000 mg | ORAL_TABLET | Freq: Three times a day (TID) | ORAL | 0 refills | Status: DC

## 2017-12-01 NOTE — Patient Instructions (Addendum)
  Please take in tapering dose as instructed by the translator and me.     IF you received an x-ray today, you will receive an invoice from [facility]. Please contact [facility] at [phone] with questions or concerns regarding your invoice.   IF you received labwork today, you will receive an invoice from [facility]. Please contact [facility] at [phone] with questions or concerns regarding your invoice.   Our billing staff will not be able to assist you with questions regarding bills from these companies.  You will be contacted with the lab results as soon as they are available. The fastest way to get your results is to activate your My Chart account. Instructions are located on the last page of this paperwork. If you have not heard from us regarding the results in 2 weeks, please contact this office.

## 2017-12-01 NOTE — Progress Notes (Signed)
[facility] [address], [place] [id number] [phone]  Date:  12/01/2017   Name:  Loretta Copeland Valladares   DOB:  Nov 29, 1956   MRN:  [id number]  PCP:  Garnetta BuddyEnglish, Hall Birchard D, PA    History of Present Illness:  Loretta Copeland is a 61 y.o. female patient who presents to PCP with  Chief Complaint  Patient presents with  . Follow-up     Patient was seen here for follow up at an attempt to obtain the lorazepam.    Per history, The patient was seen here several months prior for this lorazepam every 6 hours.  We discussed a tapering dose 04/01/2017, as this was very high.  We discussed symptoms of possible anxiety or panic attacks, which she could not give me an indication for the benzodiazepine use.  Every month, she would return after taking the medication more than prescribed, and would need an early refill.  She was non-compliant with the ssri, and in one report she did say it did not work for her, though could not express any side effects to it.  After multiple misuse, I discussed that I could not refill the medication at this dose, and I will refer her to psychiatry for evaluation and management.  She did not follow through.   She was seen seen here 4 days ago, for lorazepam.  This was not given at visit due to non-compliance in the past and multiple provider use with controlled med per policy.  She was seen at the urgent care 3 days ago for refill of the alprazolam.  This was filled with 7 pills, and advised to return to her PCP for additional refill.   Patient returns today.  She does not want to go to a psychiatrist. I have offered her other medications, which she is not open to at this time and declines (hydroxyzine, ssri, tca).  She did report that she did not tolerate some ssri given to her in the past (reported abdominal pain).    Patient Active Problem List   Diagnosis Date Noted  . Thrombophlebitis 11/15/2016  . Sinusitis, chronic 09/07/2016  . Smoking 11/06/2015     Past Medical History:  Diagnosis Date  . Arthritis   . Depression   . Migraines   . Thrombophlebitis 11/2016    No past surgical history on file.  Social History   Tobacco Use  . Smoking status: Current Some Day Smoker    Packs/day: 1.00    Years: 30.00    Pack years: 30.00    Types: Cigarettes  . Smokeless tobacco: Never Used  . Tobacco comment: not smoking every day  Substance Use Topics  . Alcohol use: No    Alcohol/week: 0.0 oz  . Drug use: No    Family History  Problem Relation Age of Onset  . Diabetes Mother   . Diabetes Father   . Diabetes Brother   . Diabetes Daughter     Allergies  Allergen Reactions  . Penicillins   . Tetracyclines & Related     Medication list has been reviewed and updated.  Current Outpatient Medications on File Prior to Visit  Medication Sig Dispense Refill  . albuterol (PROVENTIL HFA;VENTOLIN HFA) 108 (90 Base) MCG/ACT inhaler Inhale 2 puffs into the lungs every 4 (four) hours as needed for wheezing or shortness of breath. 1 Inhaler 0  . diclofenac sodium (VOLTAREN) 1 % GEL Apply 2 g topically 4 (four) times daily. 100 g 2  . LORazepam (  ATIVAN) 2 MG tablet Take 1 tablet (2 mg total) by mouth every 6 (six) hours as needed for anxiety. 7 tablet 0  . nicotine (NICODERM CQ) 14 mg/24hr patch Place 1 patch (14 mg total) onto the skin daily. Use for three months total. 28 patch 0  . predniSONE (DELTASONE) 50 MG tablet 1 tab po daily for 6 days. Take with food. 6 tablet 0  . rivaroxaban (XARELTO) 10 MG TABS tablet Take 1 tablet (10 mg total) by mouth daily. 45 tablet 0  . azithromycin (ZITHROMAX) 250 MG tablet 2 tabs po on day one, then one tablet po once daily on days 2-5. (Patient not taking: Reported on 11/27/2017) 6 tablet 0   No current facility-administered medications on file prior to visit.     ROS ROS otherwise unremarkable unless listed above.  Physical Examination: BP (!) 153/82   Pulse 94   Temp 98.2 F (36.8 C)  (Oral)   Resp 16   Ht 5\' 4"  (1.626 m)   Wt 137 lb (62.1 kg)   BMI 23.52 kg/m  Ideal Body Weight: Weight in (lb) to have BMI = 25: 145.3  Physical Exam  Constitutional: She is oriented to person, place, and time. She appears well-developed and well-nourished. No distress.  HENT:  Head: Normocephalic and atraumatic.  Right Ear: External ear normal.  Left Ear: External ear normal.  Eyes: Conjunctivae and EOM are normal. Pupils are equal, round, and reactive to light.  Cardiovascular: Normal rate.  Pulmonary/Chest: Effort normal. No respiratory distress.  Neurological: She is alert and oriented to person, place, and time.  Skin: She is not diaphoretic.  Psychiatric: She has a normal mood and affect. Her behavior is normal.     Assessment and Plan: Loretta Copeland is a 61 y.o. female who is here today for cc of  Chief Complaint  Patient presents with  . Follow-up  --I am tapering her from the alprazolam at this time for a short stent.  Sent in 3 day intervals to fill at the pharmacy.  She is not complaint in the past.  There is no given indication that I can render for the need of this med.  I have offered psychiatry referral for evaluation and management of her care.  She declines at this time.  I will taper for concern of withdrawal.   --rtc as needed. Medication management  Trena PlattStephanie Annaleigh Steinmeyer, PA-C [facility] 12/30/20185:41 PM

## 2017-12-03 ENCOUNTER — Ambulatory Visit: Payer: Self-pay | Admitting: Physician Assistant

## 2017-12-04 ENCOUNTER — Ambulatory Visit: Payer: Self-pay | Admitting: Physician Assistant

## 2017-12-10 ENCOUNTER — Ambulatory Visit: Payer: Self-pay | Admitting: Physician Assistant

## 2017-12-14 ENCOUNTER — Encounter: Payer: Self-pay | Admitting: Physician Assistant

## 2017-12-20 ENCOUNTER — Other Ambulatory Visit: Payer: Self-pay | Admitting: Physician Assistant

## 2017-12-22 NOTE — Telephone Encounter (Signed)
Requesting refill. thanks

## 2017-12-22 NOTE — Telephone Encounter (Signed)
Request for controlled substance 

## 2018-03-18 ENCOUNTER — Encounter: Payer: Self-pay | Admitting: Physician Assistant

## 2018-08-25 ENCOUNTER — Encounter (HOSPITAL_COMMUNITY): Payer: Self-pay | Admitting: Emergency Medicine

## 2018-08-25 ENCOUNTER — Ambulatory Visit (INDEPENDENT_AMBULATORY_CARE_PROVIDER_SITE_OTHER): Payer: Self-pay

## 2018-08-25 ENCOUNTER — Ambulatory Visit (HOSPITAL_COMMUNITY)
Admission: EM | Admit: 2018-08-25 | Discharge: 2018-08-25 | Disposition: A | Discharge status: Home / Self Care | Payer: Self-pay | Attending: Family Medicine | Admitting: Family Medicine

## 2018-08-25 DIAGNOSIS — M25561 Pain in right knee: Secondary | ICD-10-CM

## 2018-08-25 MED ORDER — CETIRIZINE HCL 5 MG PO TABS: 5.0000 mg | ORAL_TABLET | Freq: Every day | ORAL | 1 refills | Status: DC

## 2018-08-25 MED ORDER — FLUTICASONE PROPIONATE 50 MCG/ACT NA SUSP: 1.0000 | Freq: Every day | NASAL | 2 refills | Status: DC

## 2018-08-25 MED ORDER — KETOROLAC TROMETHAMINE 30 MG/ML IJ SOLN
INTRAMUSCULAR | Status: AC
  Filled 2018-08-25: qty 1

## 2018-08-25 MED ORDER — KETOROLAC TROMETHAMINE 30 MG/ML IJ SOLN
30.0000 mg | Freq: Once | INTRAMUSCULAR | Status: AC
  Administered 2018-08-25: 30 mg via INTRAMUSCULAR

## 2018-08-25 MED ORDER — MELOXICAM 7.5 MG PO TABS: 7.5000 mg | ORAL_TABLET | Freq: Every day | ORAL | 0 refills | Status: DC

## 2018-08-25 NOTE — ED Triage Notes (Signed)
Pt sts right knee pain and left ear pain

## 2018-08-25 NOTE — Discharge Instructions (Addendum)
It was nice meeting you!!  You x ray was negative for fracture.  Its does show some degenerative changes and effusion, or swelling.  We will treat this meloxicam for pain and inflammation. You need to follow up with orthopedic for further management. We will also give you a knee sleeve to help.   I want you to start using Flonase for your ear pain.  I believe that you have some eustachian tube dysfunction. This can cause ear pain, difficulty hearing, and fullness in the ears.

## 2018-08-25 NOTE — ED Provider Notes (Signed)
[facility]    CSN: [id number] Arrival date & time: 08/25/18  1202     History   Chief Complaint Chief Complaint  Patient presents with  . Knee Pain  . Otalgia    HPI Loretta Copeland is a 62 y.o. female.   She is a 62 year old female past medical history of arthritis, depression, migraines, thrombophlebitis.  She reports 2 months of right anterior knee pain.  The pain is waxing and waning.  Reports the pain is worse in the morning and at night.  Denies any injury to the knee.  She has been taking over-the-counter pain relief medication with minimal relief.  There has been slight swelling to the anterior aspect of the knee.  No erythema, deformity.  Patient does have a history of thrombophlebitis.  There is no posterior knee swelling, warmth, redness.  She has no fevers, chills.  She has been wearing a knee brace.  She is also here with chronic left ear pain, difficulty hearing, ringing in her ears with cough and congestion.  This is an ongoing problem.  She has not been taking anything for her symptoms.  Came back in her history she has been prescribed Flonase for eustachian tube dysfunction.  She is having similar symptoms to that.   ROS per HPI           Past Medical History:  Diagnosis Date  . Arthritis   . Depression   . Migraines   . Thrombophlebitis 11/2016    Patient Active Problem List   Diagnosis Date Noted  . Thrombophlebitis 11/15/2016  . Sinusitis, chronic 09/07/2016  . Smoking 11/06/2015    History reviewed. No pertinent surgical history.  OB History   None      Home Medications    Prior to Admission medications   Medication Sig Start Date End Date Taking? Authorizing Provider  albuterol (PROVENTIL HFA;VENTOLIN HFA) 108 (90 Base) MCG/ACT inhaler Inhale 2 puffs into the lungs every 4 (four) hours as needed for wheezing or shortness of breath. 11/20/17   Hayden Rasmussen, NP  azithromycin (ZITHROMAX) 250 MG tablet 2 tabs po on day one, then  one tablet po once daily on days 2-5. Patient not taking: Reported on 11/27/2017 11/20/17   Hayden Rasmussen, NP  cetirizine (ZYRTEC) 5 MG tablet Take 1 tablet (5 mg total) by mouth daily. 08/25/18   Dahlia Byes A, NP  diclofenac sodium (VOLTAREN) 1 % GEL Apply 2 g topically 4 (four) times daily. 05/04/16   [name], MD  fluticasone (FLONASE) 50 MCG/ACT nasal spray Place 1 spray into both nostrils daily. 08/25/18   Donnice Nielsen, Gloris Manchester A, NP  LORazepam (ATIVAN) 2 MG tablet Take 1 tablet (2 mg total) by mouth every 8 (eight) hours. Patient not taking: Reported on 08/25/2018 12/01/17   [name] D, PA  LORazepam (ATIVAN) 2 MG tablet Take 1 tablet (2 mg total) by mouth 2 (two) times daily. For 3 days.  Fill after 3 days. Patient not taking: Reported on 08/25/2018 12/01/17   [name] D, PA  LORazepam (ATIVAN) 2 MG tablet Take 1 tablet (2 mg total) by mouth daily. Fill after 6 days Patient not taking: Reported on 08/25/2018 12/01/17   [name] D, PA  meloxicam (MOBIC) 7.5 MG tablet Take 1 tablet (7.5 mg total) by mouth daily. 08/25/18   Josey Dettmann, Gloris Manchester A, NP  nicotine (NICODERM CQ) 14 mg/24hr patch Place 1 patch (14 mg total) onto the skin daily. Use for three months total. 12/05/15  Ofilia Neas, PA-C  predniSONE (DELTASONE) 50 MG tablet 1 tab po daily for 6 days. Take with food. 11/20/17   Hayden Rasmussen, NP  rivaroxaban (XARELTO) 10 MG TABS tablet Take 1 tablet (10 mg total) by mouth daily. 12/07/16   Eyvonne Mechanic, PA-C    Family History Family History  Problem Relation Age of Onset  . Diabetes Mother   . Diabetes Father   . Diabetes Brother   . Diabetes Daughter     Social History Social History   Tobacco Use  . Smoking status: Current Some Day Smoker    Packs/day: 1.00    Years: 30.00    Pack years: 30.00    Types: Cigarettes  . Smokeless tobacco: Never Used  . Tobacco comment: not smoking every day  Substance Use Topics  . Alcohol use: No    Alcohol/week: 0.0  standard drinks  . Drug use: No     Allergies   Penicillins and Tetracyclines & related   Review of Systems Review of Systems   Physical Exam Triage Vital Signs ED Triage Vitals [08/25/18 1337]  Enc Vitals Group     BP (!) 106/54     Pulse Rate 85     Resp 18     Temp 97.9 F (36.6 C)     Temp Source Oral     SpO2 99 %     Weight      Height      Head Circumference      Peak Flow      Pain Score      Pain Loc      Pain Edu?      Excl. in GC?    No data found.  Updated Vital Signs BP (!) 106/54 (BP Location: Right Arm)   Pulse 85   Temp 97.9 F (36.6 C) (Oral)   Resp 18   SpO2 99%   Visual Acuity Right Eye Distance:   Left Eye Distance:   Bilateral Distance:    Right Eye Near:   Left Eye Near:    Bilateral Near:     Physical Exam  Constitutional: She is oriented to person, place, and time. She appears well-developed and well-nourished.  Very pleasant. Non toxic or ill appearing.     HENT:  Head: Normocephalic and atraumatic.  Nose: Nose normal.  Bilateral TMs normal.  External ears normal.  Without posterior oropharyngeal erythema, tonsillar swelling or exudates. No lesions.  No lymphadenopathy.     Eyes: Conjunctivae are normal.  Neck: Normal range of motion.  Pulmonary/Chest: Effort normal.  Musculoskeletal: Normal range of motion.  Patient has good flexion and extension of the right knee.  Mild swelling to knee.  Mildly tender to palpation of patella and surrounding patella.  No obvious bruising, erythema, deformities.  Lymphadenopathy:    She has no cervical adenopathy.  Neurological: She is alert and oriented to person, place, and time.  Skin: Skin is warm and dry.  Psychiatric: She has a normal mood and affect.  Nursing note and vitals reviewed.    UC Treatments / Results  Labs (all labs ordered are listed, but only abnormal results are displayed) Labs Reviewed - No data to display  EKG None  Radiology Dg Knee Complete 4  Views Right  Result Date: 08/25/2018 CLINICAL DATA:  Several day history of right knee pain a specially medially and posteriorly. History of previous injury. EXAM: RIGHT KNEE - COMPLETE 4+ VIEW COMPARISON:  None in PACs FINDINGS: The  bones are subjectively adequately mineralized. There is no acute or healing fracture. There is chondrocalcinosis of the menisci. The joint spaces are well maintained. There is beaking of the tibial spines and spurring of the articular margins of the patella. There is a small suprapatellar effusion. 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 No acute bony abnormality. Small suprapatellar effusion. Electronically Signed   By: David  [place] M.D.   On: 08/25/2018 14:44    Procedures Procedures (including critical care time)  Medications Ordered in UC Medications  ketorolac (TORADOL) 30 MG/ML injection 30 mg (30 mg Intramuscular Given 08/25/18 1514)    Initial Impression / Assessment and Plan / UC Course  I have reviewed the triage vital signs and the nursing notes.  Pertinent labs & imaging results that were available during my care of the patient were reviewed by me and considered in my medical decision making (see chart for details).    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 No acute bony abnormality. Small suprapatellar effusion. Will treat the pain and inflammation with meloxicam. Pt is not taking the xarelto anymore. No concern for interaction. Knee sleeve and Toradol injection in the clinic. She will need to follow up with ortho. No concern for thrombophlebitis. She is non tender to posterior knee. No redness or warmth. No fevers.  Will treat the Eustachian tube dysfunction with Flonase and zyrtec.  Follow up as needed for continued or worsening symptoms  Final Clinical Impressions(s) / UC Diagnoses   Final  diagnoses:  Acute pain of right knee     Discharge Instructions     It was nice meeting you!!  You x ray was negative for fracture.  Its does show some degenerative changes and effusion, or swelling.  We will treat this meloxicam for pain and inflammation. You need to follow up with orthopedic for further management. We will also give you a knee sleeve to help.   I want you to start using Flonase for your ear pain.  I believe that you have some eustachian tube dysfunction. This can cause ear pain, difficulty hearing, and fullness in the ears.    ED Prescriptions    Medication Sig Dispense Auth. Provider   meloxicam (MOBIC) 7.5 MG tablet Take 1 tablet (7.5 mg total) by mouth daily. 30 tablet Sanyia Dini A, NP   fluticasone (FLONASE) 50 MCG/ACT nasal spray Place 1 spray into both nostrils daily. 16 g Telsa Dillavou A, NP   cetirizine (ZYRTEC) 5 MG tablet Take 1 tablet (5 mg total) by mouth daily. 30 tablet Dahlia Byes A, NP     Controlled Substance Prescriptions [place] Controlled Substance Registry consulted? Not Applicable   Janace Aris, NP 08/25/18 1525

## 2018-12-10 ENCOUNTER — Ambulatory Visit (HOSPITAL_COMMUNITY)
Admission: EM | Admit: 2018-12-10 | Discharge: 2018-12-10 | Disposition: A | Discharge status: Home / Self Care | Payer: Self-pay | Attending: Family Medicine | Admitting: Family Medicine

## 2018-12-10 ENCOUNTER — Encounter (HOSPITAL_COMMUNITY): Payer: Self-pay | Admitting: Emergency Medicine

## 2018-12-10 DIAGNOSIS — R062 Wheezing: Secondary | ICD-10-CM | POA: Insufficient documentation

## 2018-12-10 DIAGNOSIS — F17200 Nicotine dependence, unspecified, uncomplicated: Secondary | ICD-10-CM | POA: Insufficient documentation

## 2018-12-10 DIAGNOSIS — J0100 Acute maxillary sinusitis, unspecified: Secondary | ICD-10-CM | POA: Insufficient documentation

## 2018-12-10 MED ORDER — LEVOFLOXACIN 500 MG PO TABS: 500.0000 mg | ORAL_TABLET | Freq: Every day | ORAL | 0 refills | Status: DC

## 2018-12-10 MED ORDER — ALBUTEROL SULFATE HFA 108 (90 BASE) MCG/ACT IN AERS: 2.0000 | INHALATION_SPRAY | RESPIRATORY_TRACT | 0 refills | Status: AC | PRN

## 2018-12-10 NOTE — ED Triage Notes (Signed)
[facility], cough, congestion, sinus pressure since Monday.  States she was also exposed to her nephew who was diagnosed with pneumonia.  Pt's primary language is [place].

## 2018-12-23 NOTE — ED Provider Notes (Signed)
[facility] CARE CENTER   [id number] 12/10/18 Arrival Time: 1401  ASSESSMENT & PLAN:  1. Acute non-recurrent maxillary sinusitis   2. Wheezing   3. Smoker    Meds ordered this encounter  Medications  . albuterol (PROVENTIL HFA;VENTOLIN HFA) 108 (90 Base) MCG/ACT inhaler    Sig: Inhale 2 puffs into the lungs every 4 (four) hours as needed for wheezing or shortness of breath.    Dispense:  1 Inhaler    Refill:  0  . levofloxacin (LEVAQUIN) 500 MG tablet    Sig: Take 1 tablet (500 mg total) by mouth daily.    Dispense:  7 tablet    Refill:  0   Discussed typical duration of symptoms. She is concerned over pneumonia. I reassured her that I do not suspect this at all and find no indication for chest imaging today.  OTC symptom care as needed. Ensure adequate fluid intake and rest.  Follow-up Information    [facility]   Specialty:  Urgent Care Why:  As needed. Contact information: [address] [place] [address] [phone]         Reviewed expectations re: course of current medical issues. Questions answered. Outlined signs and symptoms indicating need for more acute intervention. Patient verbalized understanding. After Visit Summary given.   SUBJECTIVE: History from: patient.  Loretta Copeland is a 63 y.o. female who presents with complaint of nasal congestion, post-nasal drainage, and sinus pain. Onset gradual, she thinks over the past two weeks; worse over the past several days. Respiratory symptoms: occasional cough but feels she is wheezing at times. No SOB. Fever: no. Overall normal PO intake without n/v. OTC treatment: cold medications without much help. History of frequent sinus infections: no. No specific aggravating or alleviating factors reported.  Social History   Tobacco Use  Smoking Status Current Some Day Smoker  . Packs/day: 1.00  . Years: 30.00  . Pack years: 30.00  . Types: Cigarettes    Smokeless Tobacco Never Used  Tobacco Comment   not smoking every day   ROS: As per HPI. All other systems negative.   OBJECTIVE:  Vitals:   12/10/18 1522  BP: 128/75  Pulse: 93  Resp: 18  Temp: 97.9 F (36.6 C)  TempSrc: Oral  SpO2: 96%     General appearance: alert; no distress HEENT: nasal congestion; clear runny nose; throat irritation secondary to post-nasal drainage; bilateral maxillary tenderness to palpation; turbinates boggy Neck: supple without LAD; trachea midline CV: RRR Lungs: unlabored respirations, symmetrical air entry; cough: mild; slight bilateral expiratory wheezing; no respiratory distress; able to speak in full sentences Skin: warm and dry Psychological: alert and cooperative; normal mood and affect  Allergies  Allergen Reactions  . Penicillins   . Tetracyclines & Related     Past Medical History:  Diagnosis Date  . Arthritis   . Depression   . Migraines   . Thrombophlebitis 11/2016   Family History  Problem Relation Age of Onset  . Diabetes Mother   . Diabetes Father   . Diabetes Brother   . Diabetes Daughter    Social History   Socioeconomic History  . Marital status: Married    Spouse name: Not on file  . Number of children: Not on file  . Years of education: Not on file  . Highest education level: Not on file  Occupational History  . Not on file  Social Needs  . Financial resource strain: Not on file  .  Food insecurity:    Worry: Not on file    Inability: Not on file  . Transportation needs:    Medical: Not on file    Non-medical: Not on file  Tobacco Use  . Smoking status: Current Some Day Smoker    Packs/day: 1.00    Years: 30.00    Pack years: 30.00    Types: Cigarettes  . Smokeless tobacco: Never Used  . Tobacco comment: not smoking every day  Substance and Sexual Activity  . Alcohol use: No    Alcohol/week: 0.0 standard drinks  . Drug use: No  . Sexual activity: Never  Lifestyle  . Physical activity:     Days per week: Not on file    Minutes per session: Not on file  . Stress: Not on file  Relationships  . Social connections:    Talks on phone: Not on file    Gets together: Not on file    Attends religious service: Not on file    Active member of club or organization: Not on file    Attends meetings of clubs or organizations: Not on file    Relationship status: Not on file  . Intimate partner violence:    Fear of current or ex partner: Not on file    Emotionally abused: Not on file    Physically abused: Not on file    Forced sexual activity: Not on file  Other Topics Concern  . Not on file  Social History Narrative  . Not on file            Mardella Layman, MD 12/23/18 [phone]

## 2019-04-02 ENCOUNTER — Other Ambulatory Visit: Payer: Self-pay

## 2019-04-02 ENCOUNTER — Emergency Department (HOSPITAL_COMMUNITY)
Admission: EM | Admit: 2019-04-02 | Discharge: 2019-04-03 | Disposition: A | Discharge status: Home / Self Care | Payer: No Typology Code available for payment source | Attending: Emergency Medicine | Admitting: Emergency Medicine

## 2019-04-02 ENCOUNTER — Emergency Department (HOSPITAL_COMMUNITY): Payer: No Typology Code available for payment source

## 2019-04-02 ENCOUNTER — Encounter (HOSPITAL_COMMUNITY): Payer: Self-pay | Admitting: Emergency Medicine

## 2019-04-02 DIAGNOSIS — Y999 Unspecified external cause status: Secondary | ICD-10-CM | POA: Diagnosis not present

## 2019-04-02 DIAGNOSIS — F17210 Nicotine dependence, cigarettes, uncomplicated: Secondary | ICD-10-CM | POA: Insufficient documentation

## 2019-04-02 DIAGNOSIS — M542 Cervicalgia: Secondary | ICD-10-CM | POA: Insufficient documentation

## 2019-04-02 DIAGNOSIS — Z79899 Other long term (current) drug therapy: Secondary | ICD-10-CM | POA: Insufficient documentation

## 2019-04-02 DIAGNOSIS — Y939 Activity, unspecified: Secondary | ICD-10-CM | POA: Insufficient documentation

## 2019-04-02 DIAGNOSIS — Y929 Unspecified place or not applicable: Secondary | ICD-10-CM | POA: Insufficient documentation

## 2019-04-02 DIAGNOSIS — R519 Headache, unspecified: Secondary | ICD-10-CM

## 2019-04-02 DIAGNOSIS — S0081XA Abrasion of other part of head, initial encounter: Secondary | ICD-10-CM | POA: Diagnosis present

## 2019-04-02 DIAGNOSIS — R51 Headache: Secondary | ICD-10-CM | POA: Insufficient documentation
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
## 2019-04-02 NOTE — ED Notes (Signed)
Patient transported to CT and xray 
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
## 2019-04-03 NOTE — Discharge Instructions (Signed)
Your work-up today showed no fracture or dislocation in your head or neck.  No intracranial injury seen as you are on blood thinners.  We suspect soft tissue and musculoskeletal pain.  Please follow-up with your PCP.  Please stay hydrated.  If any symptoms change or worsen, please return to the nearest emergency department.

## 2019-12-02 ENCOUNTER — Ambulatory Visit (HOSPITAL_COMMUNITY)
Admission: EM | Admit: 2019-12-02 | Discharge: 2019-12-02 | Disposition: A | Discharge status: Home / Self Care | Payer: Self-pay | Attending: Emergency Medicine | Admitting: Emergency Medicine

## 2019-12-02 ENCOUNTER — Other Ambulatory Visit: Payer: Self-pay

## 2019-12-02 DIAGNOSIS — M542 Cervicalgia: Secondary | ICD-10-CM

## 2019-12-02 DIAGNOSIS — S46819A Strain of other muscles, fascia and tendons at shoulder and upper arm level, unspecified arm, initial encounter: Secondary | ICD-10-CM

## 2019-12-02 DIAGNOSIS — T2114XA Burn of first degree of lower back, initial encounter: Secondary | ICD-10-CM

## 2019-12-02 MED ORDER — DEXAMETHASONE SODIUM PHOSPHATE 10 MG/ML IJ SOLN
INTRAMUSCULAR | Status: AC
  Filled 2019-12-02: qty 1

## 2019-12-02 MED ORDER — PREDNISONE 20 MG PO TABS: 20.0000 mg | ORAL_TABLET | Freq: Two times a day (BID) | ORAL | 0 refills | Status: AC

## 2019-12-02 MED ORDER — CYCLOBENZAPRINE HCL 5 MG PO TABS: 5.0000 mg | ORAL_TABLET | Freq: Two times a day (BID) | ORAL | 0 refills | Status: DC | PRN

## 2019-12-02 MED ORDER — DEXAMETHASONE SODIUM PHOSPHATE 10 MG/ML IJ SOLN
10.0000 mg | Freq: Once | INTRAMUSCULAR | Status: AC
  Administered 2019-12-02: 10 mg via INTRAMUSCULAR
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
## 2019-12-02 NOTE — Discharge Instructions (Signed)
We gave you Decadron today Continue with prednisone 20 mg twice daily for 5 days Use anti-inflammatories for pain/swelling. You may take up to 800 mg Ibuprofen every 8 hours with food. You may supplement Ibuprofen with Tylenol [phone] mg every 8 hours.  Gentle neck stretches- see attached Apply aloe to burn  Follow up if not improving

## 2019-12-02 NOTE — ED Provider Notes (Signed)
[facility]    CSN: [id number] Arrival date & time: 12/02/19  1531      History   Chief Complaint Chief Complaint  Patient presents with  . Headache    HPI [place] interpretation via [employer] interpreter Rebeca AllegraBiljana Prabhakar is a 63 y.o. female history of migraines, tobacco use, presenting today for evaluation of neck and shoulder pain.  Patient states that over the past 8 days she has had pain in her neck and shoulder.  Feels symptoms bilaterally, but more prominent on left side.  Has significantly worsened in the past 2 days.  She has been applying hot patches as well as a heating pad and believes that she slightly burned herself but she now has some minor wounds to this area on the left side.  She notes that prior to symptoms starting she was lifting furniture into a car and she accidentally hit her head on the car door.  She has had some headaches off and on since.  Denies vision changes.  Denies nausea or vomiting.  Has pain with little movement of her neck.  Denies fevers.  She has been taking Tylenol.  States that she has been told not to take ibuprofen in the past-patient cannot recall why.  HPI  Past Medical History:  Diagnosis Date  . Arthritis   . Depression   . Migraines   . Thrombophlebitis 11/2016    Patient Active Problem List   Diagnosis Date Noted  . Thrombophlebitis 11/15/2016  . Sinusitis, chronic 09/07/2016  . Smoking 11/06/2015    No past surgical history on file.  OB History   No obstetric history on file.      Home Medications    Prior to Admission medications   Medication Sig Start Date End Date Taking? Authorizing Provider  albuterol (PROVENTIL HFA;VENTOLIN HFA) 108 (90 Base) MCG/ACT inhaler Inhale 2 puffs into the lungs every 4 (four) hours as needed for wheezing or shortness of breath. 12/10/18   Mardella LaymanHagler, Brian, MD  cetirizine (ZYRTEC) 5 MG tablet Take 1 tablet (5 mg total) by mouth daily. 08/25/18   Dahlia ByesBast, Traci A, NP  cyclobenzaprine  (FLEXERIL) 5 MG tablet Take 1-2 tablets (5-10 mg total) by mouth 2 (two) times daily as needed for muscle spasms. 12/02/19   Gyselle Matthew C, PA-C  diclofenac sodium (VOLTAREN) 1 % GEL Apply 2 g topically 4 (four) times daily. 05/04/16   Elvina SidleLauenstein, Kurt, MD  fluticasone (FLONASE) 50 MCG/ACT nasal spray Place 1 spray into both nostrils daily. 08/25/18   Dahlia ByesBast, Traci A, NP  levofloxacin (LEVAQUIN) 500 MG tablet Take 1 tablet (500 mg total) by mouth daily. 12/10/18   Mardella LaymanHagler, Brian, MD  meloxicam (MOBIC) 7.5 MG tablet Take 1 tablet (7.5 mg total) by mouth daily. 08/25/18   Bast, Gloris Manchesterraci A, NP  nicotine (NICODERM CQ) 14 mg/24hr patch Place 1 patch (14 mg total) onto the skin daily. Use for three months total. 12/05/15   Ofilia Neaslark, Michael L, PA-C  predniSONE (DELTASONE) 20 MG tablet Take 1 tablet (20 mg total) by mouth 2 (two) times daily with a meal for 5 days. 12/02/19 12/07/19  Tameaka Eichhorn C, PA-C  rivaroxaban (XARELTO) 10 MG TABS tablet Take 1 tablet (10 mg total) by mouth daily. 12/07/16   Eyvonne MechanicHedges, Jeffrey, PA-C    Family History Family History  Problem Relation Age of Onset  . Diabetes Mother   . Diabetes Father   . Diabetes Brother   . Diabetes Daughter     Social History  Social History   Tobacco Use  . Smoking status: Current Some Day Smoker    Packs/day: 1.00    Years: 30.00    Pack years: 30.00    Types: Cigarettes  . Smokeless tobacco: Never Used  . Tobacco comment: not smoking every day  Substance Use Topics  . Alcohol use: No    Alcohol/week: 0.0 standard drinks  . Drug use: No     Allergies   Penicillins and Tetracyclines & related   Review of Systems Review of Systems  Constitutional: Negative for fatigue and fever.  HENT: Negative for congestion, sinus pressure and sore throat.   Eyes: Negative for photophobia, pain and visual disturbance.  Respiratory: Negative for cough and shortness of breath.   Cardiovascular: Negative for chest pain.  Gastrointestinal:  Negative for abdominal pain, nausea and vomiting.  Genitourinary: Negative for decreased urine volume and hematuria.  Musculoskeletal: Positive for myalgias, neck pain and neck stiffness.  Skin: Positive for color change and wound.  Neurological: Positive for headaches. Negative for dizziness, syncope, facial asymmetry, speech difficulty, weakness, light-headedness and numbness.     Physical Exam Triage Vital Signs ED Triage Vitals  Enc Vitals Group     BP 12/02/19 1546 (!) 144/73     Pulse Rate 12/02/19 1546 (!) 102     Resp 12/02/19 1546 18     Temp 12/02/19 1546 98.3 F (36.8 C)     Temp Source 12/02/19 1546 Oral     SpO2 12/02/19 1546 97 %     Weight --      Height --      Head Circumference --      Peak Flow --      Pain Score 12/02/19 1548 9     Pain Loc --      Pain Edu? --      Excl. in [place]? --    No data found.  Updated Vital Signs BP (!) 144/73 (BP Location: Left Arm)   Pulse (!) 102   Temp 98.3 F (36.8 C) (Oral)   Resp 18   SpO2 97%   Visual Acuity Right Eye Distance:   Left Eye Distance:   Bilateral Distance:    Right Eye Near:   Left Eye Near:    Bilateral Near:     Physical Exam Vitals and nursing note reviewed.  Constitutional:      General: She is not in acute distress.    Appearance: She is well-developed.  HENT:     Head: Normocephalic and atraumatic.  Eyes:     Extraocular Movements: Extraocular movements intact.     Conjunctiva/sclera: Conjunctivae normal.     Pupils: Pupils are equal, round, and reactive to light.  Cardiovascular:     Rate and Rhythm: Normal rate and regular rhythm.     Heart sounds: No murmur.     Comments: Negative Kernig and Brudzinski Pulmonary:     Effort: Pulmonary effort is normal. No respiratory distress.     Breath sounds: Normal breath sounds.  Abdominal:     Palpations: Abdomen is soft.     Tenderness: There is no abdominal tenderness.  Musculoskeletal:     Cervical back: Neck supple.     Comments:  Nontender to palpation of cervical spine midline, nontender throughout thoracic spine midline; mild tenderness to palpation over left thoracic musculature/left superior trapezius, limited range of motion of neck  Skin:    General: Skin is warm and dry.  Neurological:     Mental  Status: She is alert and oriented to person, place, and time.      UC Treatments / Results  Labs (all labs ordered are listed, but only abnormal results are displayed) Labs Reviewed - No data to display  EKG   Radiology No results found.  Procedures Procedures (including critical care time)  Medications Ordered in UC Medications  dexamethasone (DECADRON) injection 10 mg (has no administration in time range)    Initial Impression / Assessment and Plan / UC Course  I have reviewed the triage vital signs and the nursing notes.  Pertinent labs & imaging results that were available during my care of the patient were reviewed by me and considered in my medical decision making (see chart for details).     Patient likely with cervical/trapezius strain with secondary superficial burn to left side from heating pad.  Unable to clarify with patient due to language barrier to why she avoids ibuprofen.  Is not currently on any anti-coagulants.  Will defer given unable to clarify and patient's concern around this.  Will stick to steroids as alternative.  Will provide Decadron prior to discharge and continue on prednisone 20 mg twice daily x5 days.  Will also provide Flexeril to supplement as needed.  No nuchal rigidity, do not suspect meningitis at this time, vital signs stable.  Continue to monitor,Discussed strict return precautions. Patient verbalized understanding and is agreeable with plan.  Final Clinical Impressions(s) / UC Diagnoses   Final diagnoses:  Neck pain, acute  Strain of trapezius muscle, unspecified laterality, initial encounter  Superficial burn of back, initial encounter     Discharge  Instructions     We gave you Decadron today Continue with prednisone 20 mg twice daily for 5 days Use anti-inflammatories for pain/swelling. You may take up to 800 mg Ibuprofen every 8 hours with food. You may supplement Ibuprofen with Tylenol [phone] mg every 8 hours.  Gentle neck stretches- see attached Apply aloe to burn  Follow up if not improving   ED Prescriptions    Medication Sig Dispense Auth. Provider   cyclobenzaprine (FLEXERIL) 5 MG tablet Take 1-2 tablets (5-10 mg total) by mouth 2 (two) times daily as needed for muscle spasms. 24 tablet Marquisha Nikolov C, PA-C   predniSONE (DELTASONE) 20 MG tablet Take 1 tablet (20 mg total) by mouth 2 (two) times daily with a meal for 5 days. 10 tablet Edwardine Deschepper, [employer] C, PA-C     PDMP not reviewed this encounter.   Janith Lima, [place] 12/02/19 1637

## 2020-01-17 ENCOUNTER — Ambulatory Visit (HOSPITAL_COMMUNITY)
Admission: EM | Admit: 2020-01-17 | Discharge: 2020-01-17 | Disposition: A | Discharge status: Home / Self Care | Payer: Self-pay | Attending: Family Medicine | Admitting: Family Medicine

## 2020-01-17 ENCOUNTER — Other Ambulatory Visit: Payer: Self-pay

## 2020-01-17 ENCOUNTER — Encounter (HOSPITAL_COMMUNITY): Payer: Self-pay

## 2020-01-17 DIAGNOSIS — M25561 Pain in right knee: Secondary | ICD-10-CM

## 2020-01-17 DIAGNOSIS — G8929 Other chronic pain: Secondary | ICD-10-CM

## 2020-01-17 MED ORDER — METHYLPREDNISOLONE ACETATE 80 MG/ML IJ SUSP
80.0000 mg | Freq: Once | INTRAMUSCULAR | Status: AC
  Administered 2020-01-17: 17:00:00 80 mg via INTRAMUSCULAR

## 2020-01-17 MED ORDER — METHYLPREDNISOLONE ACETATE 80 MG/ML IJ SUSP
INTRAMUSCULAR | Status: AC
  Filled 2020-01-17: qty 1

## 2020-01-17 NOTE — ED Triage Notes (Signed)
Pt presents with chronic right knee and leg pain; pt states usually she gets injections in her knee to help with pain and inflammation.

## 2020-01-17 NOTE — ED Provider Notes (Signed)
[place]    CSN: [id number] Arrival date & time: 01/17/20  1551      History   Chief Complaint Chief Complaint  Patient presents with  . Knee Pain  . Leg Pain    HPI Loretta Copeland is a 64 y.o. female.   HPI  Here for right knee pain Has been seen here for same Has known OA of right knee Wants a cortisone shot The one in her neck helped her "all over" Does not have a PCP Translation by phone - very difficult as she speaks a dialect of [place] She also repeats herself frequently so every answer is long winded, and not necessarily addressing  The question asked. No fall or injury   Past Medical History:  Diagnosis Date  . Arthritis   . Depression   . Migraines   . Thrombophlebitis 11/2016    Patient Active Problem List   Diagnosis Date Noted  . Thrombophlebitis 11/15/2016  . Sinusitis, chronic 09/07/2016  . Smoking 11/06/2015    History reviewed. No pertinent surgical history.  OB History   No obstetric history on file.      Home Medications    Prior to Admission medications   Medication Sig Start Date End Date Taking? Authorizing Provider  albuterol (PROVENTIL HFA;VENTOLIN HFA) 108 (90 Base) MCG/ACT inhaler Inhale 2 puffs into the lungs every 4 (four) hours as needed for wheezing or shortness of breath. 12/10/18   Vanessa Kick, MD  cetirizine (ZYRTEC) 5 MG tablet Take 1 tablet (5 mg total) by mouth daily. 08/25/18   Loura Halt A, NP  diclofenac sodium (VOLTAREN) 1 % GEL Apply 2 g topically 4 (four) times daily. 05/04/16   Robyn Haber, MD  fluticasone (FLONASE) 50 MCG/ACT nasal spray Place 1 spray into both nostrils daily. 08/25/18   Loura Halt A, NP  rivaroxaban (XARELTO) 10 MG TABS tablet Take 1 tablet (10 mg total) by mouth daily. 12/07/16 01/17/20  Okey Regal, PA-C    Family History Family History  Problem Relation Age of Onset  . Diabetes Mother   . Diabetes Father   . Diabetes Brother   . Diabetes Daughter     Social  History Social History   Tobacco Use  . Smoking status: Current Some Day Smoker    Packs/day: 1.00    Years: 30.00    Pack years: 30.00    Types: Cigarettes  . Smokeless tobacco: Never Used  . Tobacco comment: not smoking every day  Substance Use Topics  . Alcohol use: No    Alcohol/week: 0.0 standard drinks  . Drug use: No     Allergies   Penicillins and Tetracyclines & related   Review of Systems Review of Systems  Musculoskeletal: Positive for arthralgias, gait problem and joint swelling.     Physical Exam Triage Vital Signs ED Triage Vitals [01/17/20 1640]  Enc Vitals Group     BP 112/64     Pulse Rate 96     Resp 17     Temp 98.1 F (36.7 C)     Temp Source Oral     SpO2 95 %     Weight      Height      Head Circumference      Peak Flow      Pain Score 8     Pain Loc      Pain Edu?      Excl. in [place]?    No data found.  Updated Vital  Signs BP 112/64 (BP Location: Left Arm)   Pulse 96   Temp 98.1 F (36.7 C) (Oral)   Resp 17   SpO2 95%      Physical Exam Constitutional:      General: She is not in acute distress.    Appearance: She is well-developed and normal weight.  HENT:     Head: Normocephalic and atraumatic.  Eyes:     Conjunctiva/sclera: Conjunctivae normal.     Pupils: Pupils are equal, round, and reactive to light.  Cardiovascular:     Rate and Rhythm: Normal rate.  Pulmonary:     Effort: Pulmonary effort is normal. No respiratory distress.  Abdominal:     General: There is distension.  Musculoskeletal:        General: Normal range of motion.     Cervical back: Normal range of motion.     Comments: Right knee with warmth.  No effusion.  Med joint line tender.  No instability  Skin:    General: Skin is warm and dry.  Neurological:     Mental Status: She is alert. Mental status is at baseline.     Gait: Gait normal.  Psychiatric:        Mood and Affect: Mood normal.    Old films reviewed  UC Treatments / Results   Labs (all labs ordered are listed, but only abnormal results are displayed) Labs Reviewed - No data to display  EKG   Radiology No results found.  Procedures Procedures (including critical care time)  Medications Ordered in UC Medications  methylPREDNISolone acetate (DEPO-MEDROL) injection 80 mg (80 mg Intramuscular Given 01/17/20 1713)    Initial Impression / Assessment and Plan / UC Course  I have reviewed the triage vital signs and the nursing notes.  Pertinent labs & imaging results that were available during my care of the patient were reviewed by me and considered in my medical decision making (see chart for details).      Final Clinical Impressions(s) / UC Diagnoses   Final diagnoses:  Chronic pain of right knee     Discharge Instructions     Contact community health and wellness for an appointment   ED Prescriptions    None     PDMP not reviewed this encounter.   Eustace Moore, MD 01/17/20 1725

## 2020-01-17 NOTE — Discharge Instructions (Signed)
Contact community health and wellness for an appointment

## 2020-01-24 ENCOUNTER — Other Ambulatory Visit: Payer: Self-pay

## 2020-01-24 ENCOUNTER — Ambulatory Visit (HOSPITAL_COMMUNITY)
Admission: EM | Admit: 2020-01-24 | Discharge: 2020-01-24 | Disposition: A | Discharge status: Home / Self Care | Payer: Self-pay | Attending: Physician Assistant | Admitting: Physician Assistant

## 2020-01-24 ENCOUNTER — Encounter (HOSPITAL_COMMUNITY): Payer: Self-pay

## 2020-01-24 DIAGNOSIS — G8929 Other chronic pain: Secondary | ICD-10-CM

## 2020-01-24 DIAGNOSIS — M542 Cervicalgia: Secondary | ICD-10-CM

## 2020-01-24 DIAGNOSIS — M25561 Pain in right knee: Secondary | ICD-10-CM

## 2020-01-24 MED ORDER — KETOROLAC TROMETHAMINE 30 MG/ML IJ SOLN
INTRAMUSCULAR | Status: AC
  Filled 2020-01-24: qty 1

## 2020-01-24 MED ORDER — KETOROLAC TROMETHAMINE 30 MG/ML IJ SOLN
30.0000 mg | Freq: Once | INTRAMUSCULAR | Status: AC
  Administered 2020-01-24: 21:00:00 30 mg via INTRAMUSCULAR

## 2020-01-24 MED ORDER — PREDNISONE 10 MG PO TABS: 20.0000 mg | ORAL_TABLET | Freq: Every day | ORAL | 0 refills | Status: AC

## 2020-01-24 MED ORDER — CYCLOBENZAPRINE HCL 10 MG PO TABS: 10.0000 mg | ORAL_TABLET | Freq: Two times a day (BID) | ORAL | 0 refills | Status: DC | PRN

## 2020-01-24 MED ORDER — PREDNISONE 10 MG PO TABS: 20.0000 mg | ORAL_TABLET | Freq: Every day | ORAL | 0 refills | Status: DC

## 2020-01-24 NOTE — ED Provider Notes (Addendum)
**Note Loretta-Identified via Obfuscation** [place]    CSN: [id number] Arrival date & time: 01/24/20  [place]      History   Chief Complaint Chief Complaint  Patient presents with  . Neck Pain    HPI Loretta Copeland is a 64 y.o. female.   Patient reports to urgent care today for neck pain and knee pain. She has had pain that radiates down her neck into her arms for some time. She was seen previously in 11/2019 and on 01/17/2020 for similar issues. She reports only "cortisone helps" however points to flexeril and prednisone as medications that helped in 11/2019. She reports this pain is at her normal level. She states she believes her neck pain is coming from her having to toss and turn at night due to her leg pain  She also reports right knee pain and points to the front of her knee. As well as pain at her right hip  She denies changes in her symptoms from previous  The encounter is extremely limited by her Language barrier, despite usage of a requested cro[place] interpreter. Patient often talks in circles and does not answer questions directly. She frequently requests something for her pain. Exam was very limited due to her not being able to follow directions.    A [place] Interpreter via [employer] was utilized for much of this encounter, though it seems there continued to be a language barrier and interpreter connection was lost at visit wrap up. Patient also frequently interjected in [place] when interpreter would begin translating.     Past Medical History:  Diagnosis Date  . Arthritis   . Depression   . Migraines   . Thrombophlebitis 11/2016    Patient Active Problem List   Diagnosis Date Noted  . Thrombophlebitis 11/15/2016  . Sinusitis, chronic 09/07/2016  . Smoking 11/06/2015    History reviewed. No pertinent surgical history.  OB History   No obstetric history on file.      Home Medications    Prior to Admission medications   Medication Sig Start Date End Date Taking?  Authorizing Provider  albuterol (PROVENTIL HFA;VENTOLIN HFA) 108 (90 Base) MCG/ACT inhaler Inhale 2 puffs into the lungs every 4 (four) hours as needed for wheezing or shortness of breath. 12/10/18   Vanessa Kick, MD  cetirizine (ZYRTEC) 5 MG tablet Take 1 tablet (5 mg total) by mouth daily. 08/25/18   Loura Halt A, NP  cyclobenzaprine (FLEXERIL) 10 MG tablet Take 1 tablet (10 mg total) by mouth 2 (two) times daily as needed for muscle spasms. 01/24/20   Izaih Kataoka, Marguerita Beards, PA-C  diclofenac sodium (VOLTAREN) 1 % GEL Apply 2 g topically 4 (four) times daily. 05/04/16   Robyn Haber, MD  fluticasone (FLONASE) 50 MCG/ACT nasal spray Place 1 spray into both nostrils daily. 08/25/18   Loura Halt A, NP  predniSONE (DELTASONE) 10 MG tablet Take 2 tablets (20 mg total) by mouth daily for 5 days. 01/24/20 01/29/20  Levon Penning, Marguerita Beards, PA-C  rivaroxaban (XARELTO) 10 MG TABS tablet Take 1 tablet (10 mg total) by mouth daily. 12/07/16 01/17/20  Okey Regal, PA-C    Family History Family History  Problem Relation Age of Onset  . Diabetes Mother   . Diabetes Father   . Diabetes Brother   . Diabetes Daughter     Social History Social History   Tobacco Use  . Smoking status: Current Some Day Smoker    Packs/day: 1.00    Years: 30.00    Pack years:  30.00    Types: Cigarettes  . Smokeless tobacco: Never Used  . Tobacco comment: not smoking every day  Substance Use Topics  . Alcohol use: No    Alcohol/week: 0.0 standard drinks  . Drug use: No     Allergies   Penicillins and Tetracyclines & related   Review of Systems Review of Systems  Musculoskeletal: Positive for arthralgias, back pain, myalgias, neck pain and neck stiffness.  Neurological: Negative for weakness.     Physical Exam Triage Vital Signs ED Triage Vitals  Enc Vitals Group     BP 01/24/20 1924 (!) 146/70     Pulse Rate 01/24/20 1924 84     Resp 01/24/20 1924 16     Temp 01/24/20 1924 97.8 F (36.6 C)     Temp Source 01/24/20  1924 Oral     SpO2 01/24/20 1924 99 %     Weight --      Height --      Head Circumference --      Peak Flow --      Pain Score 01/24/20 1922 6     Pain Loc --      Pain Edu? --      Excl. in [place]? --    No data found.  Updated Vital Signs BP (!) 146/70 (BP Location: Left Arm)   Pulse 84   Temp 97.8 F (36.6 C) (Oral)   Resp 16   SpO2 99%   Visual Acuity Right Eye Distance:   Left Eye Distance:   Bilateral Distance:    Right Eye Near:   Left Eye Near:    Bilateral Near:     Physical Exam Vitals and nursing note reviewed.  Constitutional:      General: She is not in acute distress.    Appearance: Normal appearance. She is well-developed. She is not ill-appearing.  HENT:     Head: Normocephalic and atraumatic.  Eyes:     General: No scleral icterus.    Conjunctiva/sclera: Conjunctivae normal.     Pupils: Pupils are equal, round, and reactive to light.  Cardiovascular:     Rate and Rhythm: Normal rate.  Pulmonary:     Effort: Pulmonary effort is normal. No respiratory distress.  Abdominal:     Palpations: Abdomen is soft.     Tenderness: There is no abdominal tenderness.  Musculoskeletal:        General: Tenderness (right knee, bilateral paracervical muscles) present. No swelling, deformity or signs of injury.     Cervical back: Neck supple.     Right lower leg: No edema.     Left lower leg: No edema.  Skin:    General: Skin is warm and dry.  Neurological:     General: No focal deficit present.     Mental Status: She is alert and oriented to person, place, and time.  Psychiatric:        Behavior: Behavior normal.        Thought Content: Thought content normal.        Judgment: Judgment normal.      UC Treatments / Results  Labs (all labs ordered are listed, but only abnormal results are displayed) Labs Reviewed - No data to display  EKG   Radiology No results found.  Procedures Procedures (including critical care time)  Medications Ordered in  UC Medications  ketorolac (TORADOL) 30 MG/ML injection 30 mg (30 mg Intramuscular Given 01/24/20 2036)    Initial Impression / Assessment and  Plan / UC Course  I have reviewed the triage vital signs and the nursing notes.  Pertinent labs & imaging results that were available during my care of the patient were reviewed by me and considered in my medical decision making (see chart for details).     #Neck pain #chronic knee pain Patient appears to have similar complaints as previous visits, though it is not completely clear the entirety of her complaints due to translation issues. She is fixated on the need for a cortisone injection, however she has not followed with PCP or orthopedics to receive this. She has received severe IM steroid injections and pain management regiments in the last 2 months. Her pain appears to have radicular in nature at times with complain of neck pain going into her arms. However due to language barrier she can not or possibly will not participate in exam. Her symptoms and exam do not seem emergent in nature.  - toradol given in clinic - flexeril and prednisone given, as she believes this worked best - instructed she needs to contact community health and wellness given her need for PCP follow up.  Final Clinical Impressions(s) / UC Diagnoses   Final diagnoses:  Neck pain  Chronic pain of right knee     Discharge Instructions     Take the prednisone daily for 5 days  Take the flexeril once in the morning and once at night  Take tylenol for your knee pain  Please call the community health and wellness center again to establish care      ED Prescriptions    Medication Sig Dispense Auth. Provider   predniSONE (DELTASONE) 10 MG tablet  (Status: Discontinued) Take 2 tablets (20 mg total) by mouth daily for 5 days. 10 tablet Carlos Heber, Veryl Speak, PA-C   cyclobenzaprine (FLEXERIL) 10 MG tablet  (Status: Discontinued) Take 1 tablet (10 mg total) by mouth 2 (two)  times daily as needed for muscle spasms. 20 tablet Smt Lokey, Veryl Speak, PA-C   cyclobenzaprine (FLEXERIL) 10 MG tablet Take 1 tablet (10 mg total) by mouth 2 (two) times daily as needed for muscle spasms. 20 tablet Trang Bouse, Veryl Speak, PA-C   predniSONE (DELTASONE) 10 MG tablet Take 2 tablets (20 mg total) by mouth daily for 5 days. 10 tablet Constantine Ruddick, Veryl Speak, PA-C     PDMP not reviewed this encounter.   Hermelinda Medicus, PA-C 01/25/20 0104    Kyndle Schlender, Veryl Speak, PA-C 01/25/20 0105

## 2020-01-24 NOTE — Discharge Instructions (Signed)
Take the prednisone daily for 5 days  Take the flexeril once in the morning and once at night  Take tylenol for your knee pain  Please call the community health and wellness center again to establish care

## 2020-01-24 NOTE — ED Triage Notes (Signed)
Patient presents to Urgent Care with complaints of continued neck pain since her last visit. Patient reports she was prescribed prednisone but states it does not work as well as cortisone.  Patient has not followed up with the recommended PCP as directed.Pt is frustrated with her inability to sleep due to the pain in her neck and legs and hips.

## 2020-08-21 ENCOUNTER — Encounter (HOSPITAL_COMMUNITY): Payer: Self-pay

## 2020-08-21 ENCOUNTER — Other Ambulatory Visit: Payer: Self-pay

## 2020-08-21 ENCOUNTER — Ambulatory Visit (HOSPITAL_COMMUNITY)
Admission: EM | Admit: 2020-08-21 | Discharge: 2020-08-21 | Disposition: A | Discharge status: Home / Self Care | Payer: 59 | Attending: Urgent Care | Admitting: Urgent Care

## 2020-08-21 DIAGNOSIS — M1990 Unspecified osteoarthritis, unspecified site: Secondary | ICD-10-CM

## 2020-08-21 DIAGNOSIS — J309 Allergic rhinitis, unspecified: Secondary | ICD-10-CM

## 2020-08-21 DIAGNOSIS — G8929 Other chronic pain: Secondary | ICD-10-CM

## 2020-08-21 MED ORDER — PREDNISONE 20 MG PO TABS: ORAL_TABLET | ORAL | 0 refills | Status: DC

## 2020-08-21 MED ORDER — CETIRIZINE HCL 10 MG PO TABS: 10.0000 mg | ORAL_TABLET | Freq: Every day | ORAL | 0 refills | Status: AC

## 2020-08-21 MED ORDER — CETIRIZINE HCL 10 MG PO TABS: 10.0000 mg | ORAL_TABLET | Freq: Every day | ORAL | 0 refills | Status: DC

## 2020-08-21 NOTE — Discharge Instructions (Addendum)
Please contact Loretta Copeland for a consultation on your right knee arthritis. Use prednisone course to help with your arthritis flare in your knee and your flare of allergic rhinitis. Start this tomorrow morning. Tonight start taking cetirizine once daily for your sinuses.

## 2020-08-21 NOTE — ED Triage Notes (Signed)
Pt c/o pain in both ears, sinus pain/pressure she's had for years. Pt c/o right leg pain she had for years.

## 2020-08-21 NOTE — ED Provider Notes (Signed)
[facility]   MRN: [id number] DOB: 1956/06/14  Subjective:   Loretta Copeland is a 64 y.o. female presenting for acute on chronic right-sided knee pain with swelling.  No trauma, erythema or warmth.  Patient also has chronic recurrent bilateral ear fullness, sinus congestion.  She is requesting antibiotic for this.  No current facility-administered medications for this encounter.  Current Outpatient Medications:  .  albuterol (PROVENTIL HFA;VENTOLIN HFA) 108 (90 Base) MCG/ACT inhaler, Inhale 2 puffs into the lungs every 4 (four) hours as needed for wheezing or shortness of breath., Disp: 1 Inhaler, Rfl: 0 .  cetirizine (ZYRTEC ALLERGY) 10 MG tablet, Take 1 tablet (10 mg total) by mouth daily., Disp: 90 tablet, Rfl: 0 .  cyclobenzaprine (FLEXERIL) 10 MG tablet, Take 1 tablet (10 mg total) by mouth 2 (two) times daily as needed for muscle spasms., Disp: 20 tablet, Rfl: 0 .  diclofenac sodium (VOLTAREN) 1 % GEL, Apply 2 g topically 4 (four) times daily., Disp: 100 g, Rfl: 2 .  predniSONE (DELTASONE) 20 MG tablet, Take 2 tablets daily with breakfast., Disp: 10 tablet, Rfl: 0   Allergies  Allergen Reactions  . Penicillins   . Tetracyclines & Related     Past Medical History:  Diagnosis Date  . Arthritis   . Depression   . Migraines   . Thrombophlebitis 11/2016     History reviewed. No pertinent surgical history.  Family History  Problem Relation Age of Onset  . Diabetes Mother   . Diabetes Father   . Diabetes Brother   . Diabetes Daughter     Social History   Tobacco Use  . Smoking status: Current Some Day Smoker    Packs/day: 1.00    Years: 30.00    Pack years: 30.00    Types: Cigarettes  . Smokeless tobacco: Never Used  . Tobacco comment: not smoking every day  Vaping Use  . Vaping Use: Never used  Substance Use Topics  . Alcohol use: No    Alcohol/week: 0.0 standard drinks  . Drug use: No    ROS   Objective:   Vitals: BP 126/66   Pulse 75    Temp 98.4 F (36.9 C) (Oral)   Resp 16   Ht 5' 2.5" (1.588 m)   Wt 145 lb (65.8 kg)   SpO2 99%   BMI 26.10 kg/m   Physical Exam Constitutional:      General: She is not in acute distress.    Appearance: She is well-developed. She is not ill-appearing.  HENT:     Head: Normocephalic and atraumatic.     Right Ear: Ear canal and external ear normal. No drainage or tenderness. No middle ear effusion. There is no impacted cerumen. Tympanic membrane is not erythematous.     Left Ear: Ear canal and external ear normal. No drainage or tenderness.  No middle ear effusion. There is no impacted cerumen. Tympanic membrane is not erythematous.     Ears:     Comments: TMs opacified bilaterally but without erythema.    Nose: Congestion present. No rhinorrhea.     Comments: Nasal turbinates boggy and edematous.    Mouth/Throat:     Mouth: Mucous membranes are moist. No oral lesions.     Pharynx: No pharyngeal swelling, oropharyngeal exudate, posterior oropharyngeal erythema or uvula swelling.     Tonsils: No tonsillar exudate or tonsillar abscesses.  Eyes:     General: No scleral icterus.       Right eye: No  discharge.        Left eye: No discharge.     Extraocular Movements: Extraocular movements intact.     Right eye: Normal extraocular motion.     Left eye: Normal extraocular motion.     Conjunctiva/sclera: Conjunctivae normal.     Pupils: Pupils are equal, round, and reactive to light.  Cardiovascular:     Rate and Rhythm: Normal rate.  Pulmonary:     Effort: Pulmonary effort is normal.  Musculoskeletal:     Cervical back: Normal range of motion and neck supple.     Right knee: No swelling, deformity, effusion, erythema, ecchymosis, bony tenderness or crepitus. Normal range of motion. Tenderness present over the medial joint line, lateral joint line and patellar tendon. Normal alignment and normal patellar mobility.  Lymphadenopathy:     Cervical: No cervical adenopathy.  Skin:     General: Skin is warm and dry.  Neurological:     General: No focal deficit present.     Mental Status: She is alert and oriented to person, place, and time.     Motor: No weakness.     Coordination: Coordination normal.     Gait: Gait normal.     Deep Tendon Reflexes: Reflexes normal.  Psychiatric:        Mood and Affect: Mood normal.        Behavior: Behavior normal.        Thought Content: Thought content normal.        Judgment: Judgment normal.      Assessment and Plan :   PDMP not reviewed this encounter.  1. Arthritis   2. Chronic pain of right knee   3. Chronic allergic rhinitis     Acute on chronic right knee pain.  Patient has documented history of arthritis.  Recommended prednisone course, follow-up with orthopedist.  Patient also has acute on chronic allergic rhinitis.  Will use prednisone course for this.  Recommended she start taking Zyrtec daily. Counseled patient on potential for adverse effects with medications prescribed/recommended today, ER and return-to-clinic precautions discussed, patient verbalized understanding.    Wallis Bamberg, [place] 08/22/20 [phone]

## 2020-11-17 ENCOUNTER — Ambulatory Visit (HOSPITAL_COMMUNITY)
Admission: EM | Admit: 2020-11-17 | Discharge: 2020-11-17 | Disposition: A | Discharge status: Home / Self Care | Payer: 59 | Attending: Emergency Medicine | Admitting: Emergency Medicine

## 2020-11-17 ENCOUNTER — Other Ambulatory Visit: Payer: Self-pay

## 2020-11-17 ENCOUNTER — Encounter (HOSPITAL_COMMUNITY): Payer: Self-pay | Admitting: Emergency Medicine

## 2020-11-17 DIAGNOSIS — J321 Chronic frontal sinusitis: Secondary | ICD-10-CM

## 2020-11-17 DIAGNOSIS — R059 Cough, unspecified: Secondary | ICD-10-CM | POA: Diagnosis not present

## 2020-11-17 MED ORDER — ERYTHROMYCIN BASE 250 MG PO TABS: 250.0000 mg | ORAL_TABLET | Freq: Four times a day (QID) | ORAL | 0 refills | Status: DC

## 2020-11-17 MED ORDER — PREDNISONE 10 MG (21) PO TBPK: ORAL_TABLET | Freq: Every day | ORAL | 0 refills | Status: DC

## 2020-11-17 NOTE — Discharge Instructions (Addendum)
Take prednisone and erythromycin as directed.

## 2020-11-17 NOTE — ED Triage Notes (Signed)
Using [place] interpreter: Pt states she is feeling a lot of pressure in her eyes and her face. She states she was seen 3 months ago and given medication for it. She states she has asthma and has not been able to cough up the mucus.

## 2020-11-17 NOTE — ED Provider Notes (Signed)
____________________________________________  Time seen: Approximately 8:58 PM  I have reviewed the triage vital signs and the nursing notes.   HISTORY  Chief Complaint Sinus Problem and Cough   Historian Patient     HPI Loretta Copeland is a 64 y.o. female presents to the urgent care requesting medications for erythromycin and prednisone.  Patient states that she has frequent bouts of sinusitis throughout the year and only certain medications work for her.  Patient states that she has been prescribed erythromycin in the past and she prefers it for sinusitis.  Patient also states that she has a history of cervical radiculopathy and would like a prescription for prednisone.  She denies any falls or traumas.  She denies fever and chills at home.  No chest pain, chest tightness or abdominal pain.   Past Medical History:  Diagnosis Date  . Arthritis   . Depression   . Migraines   . Thrombophlebitis 11/2016     Immunizations up to date:  Yes.     Past Medical History:  Diagnosis Date  . Arthritis   . Depression   . Migraines   . Thrombophlebitis 11/2016    Patient Active Problem List   Diagnosis Date Noted  . Thrombophlebitis 11/15/2016  . Sinusitis, chronic 09/07/2016  . Smoking 11/06/2015    History reviewed. No pertinent surgical history.  Prior to Admission medications   Medication Sig Start Date End Date Taking? Authorizing Provider  albuterol (PROVENTIL HFA;VENTOLIN HFA) 108 (90 Base) MCG/ACT inhaler Inhale 2 puffs into the lungs every 4 (four) hours as needed for wheezing or shortness of breath. 12/10/18   Mardella Layman, MD  cetirizine (ZYRTEC ALLERGY) 10 MG tablet Take 1 tablet (10 mg total) by mouth daily. 08/21/20   Wallis Bamberg, PA-C  cyclobenzaprine (FLEXERIL) 10 MG tablet Take 1 tablet (10 mg total) by mouth 2 (two) times daily as needed for muscle spasms. 01/24/20   Darr, Gerilyn Pilgrim, PA-C  diclofenac sodium (VOLTAREN) 1 % GEL Apply 2 g topically 4 (four) times  daily. 05/04/16   [name], MD  erythromycin (E-MYCIN) 250 MG tablet Take 1 tablet (250 mg total) by mouth every 6 (six) hours for 7 days. 11/17/20 11/24/20  Orvil Feil, PA-C  predniSONE (STERAPRED UNI-PAK 21 TAB) 10 MG (21) TBPK tablet Take by mouth daily. Take 6 tabs by mouth daily  for 2 days, then 5 tabs for 2 days, then 4 tabs for 2 days, then 3 tabs for 2 days, 2 tabs for 2 days, then 1 tab by mouth daily for 2 days 11/17/20   Pia Mau M, PA-C  fluticasone [facility]) 50 MCG/ACT nasal spray Place 1 spray into both nostrils daily. 08/25/18 08/21/20  Dahlia Byes A, NP  rivaroxaban (XARELTO) 10 MG TABS tablet Take 1 tablet (10 mg total) by mouth daily. 12/07/16 01/17/20  Eyvonne Mechanic, PA-C    Allergies Penicillins and Tetracyclines & related  Family History  Problem Relation Age of Onset  . Diabetes Mother   . Diabetes Father   . Diabetes Brother   . Diabetes Daughter     Social History Social History   Tobacco Use  . Smoking status: Current Some Day Smoker    Packs/day: 1.00    Years: 30.00    Pack years: 30.00    Types: Cigarettes  . Smokeless tobacco: Never Used  . Tobacco comment: not smoking every day  Vaping Use  . Vaping Use: Never used  Substance Use Topics  . Alcohol use: No  Alcohol/week: 0.0 standard drinks  . Drug use: No     Review of Systems  Constitutional: No fever/chills Eyes:  No discharge ENT: Patient has facial pressure and pain. Respiratory: Patient has sporadic cough.. Gastrointestinal:   No nausea, no vomiting.  No diarrhea.  No constipation. Musculoskeletal: Patient has neck pain with cervical radiculopathy. Skin: Negative for rash, abrasions, lacerations, ecchymosis.    ____________________________________________   PHYSICAL EXAM:  VITAL SIGNS: ED Triage Vitals  Enc Vitals Group     BP 11/17/20 1915 125/78     Pulse Rate 11/17/20 1915 84     Resp 11/17/20 1915 15     Temp 11/17/20 1915 98.3 F (36.8 C)     Temp  Source 11/17/20 1915 Oral     SpO2 11/17/20 1915 100 %     Weight --      Height --      Head Circumference --      Peak Flow --      Pain Score 11/17/20 1948 0     Pain Loc --      Pain Edu? --      Excl. in GC? --      Constitutional: Alert and oriented. Well appearing and in no acute distress. Eyes: Conjunctivae are normal. PERRL. EOMI. Head: Atraumatic. ENT:      Ears: TMs are pearly.      Nose: No congestion/rhinnorhea.      Mouth/Throat: Mucous membranes are moist.  Neck: No stridor.  Full range of motion. Cardiovascular: Normal rate, regular rhythm. Normal S1 and S2.  Good peripheral circulation. Respiratory: Normal respiratory effort without tachypnea or retractions. Lungs CTAB. Good air entry to the bases with no decreased or absent breath sounds Gastrointestinal: Bowel sounds x 4 quadrants. Soft and nontender to palpation. No guarding or rigidity. No distention. Musculoskeletal: Full range of motion to all extremities. No obvious deformities noted Neurologic:  Normal for age. No gross focal neurologic deficits are appreciated.  Skin:  Skin is warm, dry and intact. No rash noted. Psychiatric: Mood and affect are normal for age. Speech and behavior are normal.   ____________________________________________   LABS (all labs ordered are listed, but only abnormal results are displayed)  Labs Reviewed - No data to display ____________________________________________  EKG   ____________________________________________  RADIOLOGY   No results found.  ____________________________________________    PROCEDURES  Procedure(s) performed:     Procedures     Medications - No data to display   ____________________________________________   INITIAL IMPRESSION / ASSESSMENT AND PLAN / ED COURSE  Pertinent labs & imaging results that were available during my care of the patient were reviewed by me and considered in my medical decision making (see chart for  details).      Assessment and plan Sinusitis Cervical radiculopathy 64 year old female presents to the urgent care requesting prescriptions for erythromycin and prednisone for sinusitis and cervical radiculopathy.  She states that these medications have worked well for her in the past for aforementioned issues.  Medications were given per her request.  All patient questions were answered.    ____________________________________________  FINAL CLINICAL IMPRESSION(S) / ED DIAGNOSES  Final diagnoses:  Chronic frontal sinusitis  Cough      NEW MEDICATIONS STARTED DURING THIS VISIT:  ED Discharge Orders         Ordered    erythromycin (E-MYCIN) 250 MG tablet  Every 6 hours,   Status:  Discontinued        11/17/20 1929  predniSONE (STERAPRED UNI-PAK 21 TAB) 10 MG (21) TBPK tablet  Daily,   Status:  Discontinued        11/17/20 1929    erythromycin (E-MYCIN) 250 MG tablet  Every 6 hours        11/17/20 1933    predniSONE (STERAPRED UNI-PAK 21 TAB) 10 MG (21) TBPK tablet  Daily        11/17/20 1933              This chart was dictated using voice recognition software/Dragon. Despite best efforts to proofread, errors can occur which can change the meaning. Any change was purely unintentional.     Orvil Feil, PA-C 11/17/20 2101

## 2020-11-18 ENCOUNTER — Telehealth (HOSPITAL_COMMUNITY): Payer: Self-pay | Admitting: *Deleted

## 2020-11-18 MED ORDER — ERYTHROMYCIN BASE 250 MG PO TABS: 250.0000 mg | ORAL_TABLET | Freq: Four times a day (QID) | ORAL | 0 refills | Status: AC

## 2020-11-18 MED ORDER — PREDNISONE 10 MG (21) PO TBPK: ORAL_TABLET | Freq: Every day | ORAL | 0 refills | Status: DC

## 2021-02-01 ENCOUNTER — Encounter (HOSPITAL_COMMUNITY): Payer: Self-pay | Admitting: Emergency Medicine

## 2021-02-01 ENCOUNTER — Other Ambulatory Visit: Payer: Self-pay

## 2021-02-01 ENCOUNTER — Emergency Department (HOSPITAL_COMMUNITY)
Admission: EM | Admit: 2021-02-01 | Discharge: 2021-02-02 | Disposition: A | Discharge status: Home / Self Care | Payer: 59 | Attending: Emergency Medicine | Admitting: Emergency Medicine

## 2021-02-01 DIAGNOSIS — F17210 Nicotine dependence, cigarettes, uncomplicated: Secondary | ICD-10-CM | POA: Insufficient documentation

## 2021-02-01 DIAGNOSIS — R0981 Nasal congestion: Secondary | ICD-10-CM | POA: Diagnosis present

## 2021-02-01 DIAGNOSIS — J0101 Acute recurrent maxillary sinusitis: Secondary | ICD-10-CM | POA: Diagnosis not present

## 2021-02-01 MED ORDER — ERYTHROMYCIN BASE 250 MG PO TABS: 250.0000 mg | ORAL_TABLET | Freq: Four times a day (QID) | ORAL | 0 refills | Status: DC

## 2021-02-01 MED ORDER — PREDNISONE 50 MG PO TABS: ORAL_TABLET | ORAL | 0 refills | Status: DC

## 2021-02-01 NOTE — ED Triage Notes (Signed)
Pt reports taking antibiotics for cough and sinusitis but they are not helping. Pt awake, alert, appropriate. VSS.

## 2021-02-02 NOTE — ED Provider Notes (Signed)
[facility] EMERGENCY DEPARTMENT Provider Note   CSN: [id number] Arrival date & time: 02/01/21  1915     History Chief Complaint  Patient presents with  . Cough  . Sinus Problem    Loretta Copeland is a 65 y.o. female.  HPI    Patient with history of arthritis presents for sinusitis.  She reports she has frequent bouts of facial pain and nasal congestion.  At this time most of the pain is on the left side of her face.  No fevers are reported.  She is requesting erythromycin.  She also reports chronic joint pain.  No other acute complaints  Patient is able speak [place] Past Medical History:  Diagnosis Date  . Arthritis   . Depression   . Migraines   . Thrombophlebitis 11/2016    Patient Active Problem List   Diagnosis Date Noted  . Thrombophlebitis 11/15/2016  . Sinusitis, chronic 09/07/2016  . Smoking 11/06/2015    History reviewed. No pertinent surgical history.   OB History   No obstetric history on file.     Family History  Problem Relation Age of Onset  . Diabetes Mother   . Diabetes Father   . Diabetes Brother   . Diabetes Daughter     Social History   Tobacco Use  . Smoking status: Current Some Day Smoker    Packs/day: 1.00    Years: 30.00    Pack years: 30.00    Types: Cigarettes  . Smokeless tobacco: Never Used  . Tobacco comment: not smoking every day  Vaping Use  . Vaping Use: Never used  Substance Use Topics  . Alcohol use: No    Alcohol/week: 0.0 standard drinks  . Drug use: No    Home Medications Prior to Admission medications   Medication Sig Start Date End Date Taking? Authorizing Provider  erythromycin (E-MYCIN) 250 MG tablet Take 1 tablet (250 mg total) by mouth 4 (four) times daily. 02/01/21  Yes Zadie Rhine, MD  predniSONE (DELTASONE) 50 MG tablet 1 tablet PO QD X7 days 02/01/21  Yes Zadie Rhine, MD  albuterol (PROVENTIL HFA;VENTOLIN HFA) 108 (90 Base) MCG/ACT inhaler Inhale 2 puffs into the  lungs every 4 (four) hours as needed for wheezing or shortness of breath. 12/10/18   Mardella Layman, MD  cetirizine (ZYRTEC ALLERGY) 10 MG tablet Take 1 tablet (10 mg total) by mouth daily. 08/21/20   Wallis Bamberg, PA-C  cyclobenzaprine (FLEXERIL) 10 MG tablet Take 1 tablet (10 mg total) by mouth 2 (two) times daily as needed for muscle spasms. 01/24/20   Darr, Gerilyn Pilgrim, PA-C  diclofenac sodium (VOLTAREN) 1 % GEL Apply 2 g topically 4 (four) times daily. 05/04/16   [name], MD  fluticasone (FLONASE) 50 MCG/ACT nasal spray Place 1 spray into both nostrils daily. 08/25/18 08/21/20  Dahlia Byes A, NP  rivaroxaban (XARELTO) 10 MG TABS tablet Take 1 tablet (10 mg total) by mouth daily. 12/07/16 01/17/20  Hedges, Tinnie Gens, PA-C    Allergies    Penicillins and Tetracyclines & related  Review of Systems   Review of Systems  Constitutional: Negative for fever.  HENT: Positive for sinus pain.     Physical Exam Updated Vital Signs BP 124/68   Pulse 89   Temp 97.9 F (36.6 C) (Oral)   Resp 18   SpO2 98%   Physical Exam  CONSTITUTIONAL: Well developed/well nourished HEAD: Normocephalic/atraumatic EYES: EOMI/PERRL ENMT: Mucous membranes moist no facial swelling, no angioedema, no stridor.  No obvious  abscess. NECK: supple no meningeal signs CV: S1/S2 noted LUNGS:  no apparent distress ABDOMEN: soft NEURO: Pt is awake/alert/appropriate, moves all extremitiesx4.  No facial droop.   EXTREMITIES: pulses normal/equal, full ROM, no lower extremity edema, no obvious joint effusion SKIN: warm, color normal   ED Results / Procedures / Treatments   Labs (all labs ordered are listed, but only abnormal results are displayed) Labs Reviewed - No data to display  EKG None  Radiology No results found.  Procedures Procedures   Medications Ordered in ED Medications - No data to display  ED Course  I have reviewed the triage vital signs and the nursing notes.     MDM Rules/Calculators/A&P                           Patient request I have refilled her erythromycin.  Last dose was given over a month ago.  She is also requesting short course of prednisone which has been helpful before.  I referred her to ear nose and throat. Final Clinical Impression(s) / ED Diagnoses Final diagnoses:  Acute recurrent maxillary sinusitis    Rx / DC Orders ED Discharge Orders         Ordered    erythromycin (E-MYCIN) 250 MG tablet  4 times daily        02/01/21 2340    predniSONE (DELTASONE) 50 MG tablet        02/01/21 2340           Zadie Rhine, MD 02/02/21 [phone]

## 2021-04-13 ENCOUNTER — Other Ambulatory Visit: Payer: Self-pay

## 2021-04-13 ENCOUNTER — Encounter (HOSPITAL_COMMUNITY): Payer: Self-pay | Admitting: Emergency Medicine

## 2021-04-13 ENCOUNTER — Ambulatory Visit (HOSPITAL_COMMUNITY)
Admission: EM | Admit: 2021-04-13 | Discharge: 2021-04-13 | Disposition: A | Discharge status: Home / Self Care | Payer: 59 | Attending: Emergency Medicine | Admitting: Emergency Medicine

## 2021-04-13 DIAGNOSIS — M2550 Pain in unspecified joint: Secondary | ICD-10-CM | POA: Diagnosis not present

## 2021-04-13 DIAGNOSIS — M1990 Unspecified osteoarthritis, unspecified site: Secondary | ICD-10-CM | POA: Diagnosis not present

## 2021-04-13 MED ORDER — PREDNISONE 20 MG PO TABS: ORAL_TABLET | ORAL | 0 refills | Status: DC

## 2021-04-13 MED ORDER — KETOROLAC TROMETHAMINE 30 MG/ML IJ SOLN
15.0000 mg | Freq: Once | INTRAMUSCULAR | Status: AC
  Administered 2021-04-13: 15 mg via INTRAMUSCULAR

## 2021-04-13 MED ORDER — CYCLOBENZAPRINE HCL 5 MG PO TABS: 5.0000 mg | ORAL_TABLET | Freq: Every day | ORAL | 0 refills | Status: DC

## 2021-04-13 MED ORDER — KETOROLAC TROMETHAMINE 30 MG/ML IJ SOLN
INTRAMUSCULAR | Status: AC
  Filled 2021-04-13: qty 1

## 2021-04-13 NOTE — ED Provider Notes (Signed)
[facility]    CSN: [id number] Arrival date & time: 04/13/21  1608      History   Chief Complaint Chief Complaint  Patient presents with  . Body Pain    HPI Loretta Copeland is a 65 y.o. female.   Loretta Copeland presents with complaints of joint aches and "inflammation."  Has been ongoing for months now, but worse over the past few days without any known injury or trauma. Primarily to wrists, elbows and shoulders. No redness or warmth.   Video interpreter used to collect history and physical exam.  Use of closed captioning for better understanding due to hard of hearing.    ROS per HPI, negative if not otherwise mentioned.      Past Medical History:  Diagnosis Date  . Arthritis   . Depression   . Migraines   . Thrombophlebitis 11/2016    Patient Active Problem List   Diagnosis Date Noted  . Thrombophlebitis 11/15/2016  . Sinusitis, chronic 09/07/2016  . Smoking 11/06/2015    History reviewed. No pertinent surgical history.  OB History   No obstetric history on file.      Home Medications    Prior to Admission medications   Medication Sig Start Date End Date Taking? Authorizing Provider  cyclobenzaprine (FLEXERIL) 5 MG tablet Take 1 tablet (5 mg total) by mouth at bedtime. 04/13/21  Yes Linus Mako B, NP  predniSONE (DELTASONE) 20 MG tablet 3-3-3-2-2-2-1-1-1 04/13/21  Yes Jock Mahon, Barron Alvine, NP  albuterol (PROVENTIL HFA;VENTOLIN HFA) 108 (90 Base) MCG/ACT inhaler Inhale 2 puffs into the lungs every 4 (four) hours as needed for wheezing or shortness of breath. 12/10/18   Mardella Layman, MD  cetirizine (ZYRTEC ALLERGY) 10 MG tablet Take 1 tablet (10 mg total) by mouth daily. 08/21/20   Wallis Bamberg, PA-C  diclofenac sodium (VOLTAREN) 1 % GEL Apply 2 g topically 4 (four) times daily. 05/04/16   [name], MD  erythromycin (E-MYCIN) 250 MG tablet Take 1 tablet (250 mg total) by mouth 4 (four) times daily. 02/01/21   Zadie Rhine, MD  fluticasone  (FLONASE) 50 MCG/ACT nasal spray Place 1 spray into both nostrils daily. 08/25/18 08/21/20  Dahlia Byes A, NP  rivaroxaban (XARELTO) 10 MG TABS tablet Take 1 tablet (10 mg total) by mouth daily. 12/07/16 01/17/20  Eyvonne Mechanic, PA-C    Family History Family History  Problem Relation Age of Onset  . Diabetes Mother   . Diabetes Father   . Diabetes Brother   . Diabetes Daughter     Social History Social History   Tobacco Use  . Smoking status: Current Some Day Smoker    Packs/day: 1.00    Years: 30.00    Pack years: 30.00    Types: Cigarettes  . Smokeless tobacco: Never Used  . Tobacco comment: not smoking every day  Vaping Use  . Vaping Use: Never used  Substance Use Topics  . Alcohol use: No    Alcohol/week: 0.0 standard drinks  . Drug use: No     Allergies   Penicillins and Tetracyclines & related   Review of Systems Review of Systems   Physical Exam Triage Vital Signs ED Triage Vitals  Enc Vitals Group     BP 04/13/21 1627 (!) 108/91     Pulse Rate 04/13/21 1627 87     Resp 04/13/21 1627 18     Temp 04/13/21 1627 98.4 F (36.9 C)     Temp Source 04/13/21 1627 Oral  SpO2 04/13/21 1627 99 %     Weight --      Height --      Head Circumference --      Peak Flow --      Pain Score 04/13/21 1631 10     Pain Loc --      Pain Edu? --      Excl. in GC? --    No data found.  Updated Vital Signs BP (!) 108/91 (BP Location: Left Arm)   Pulse 87   Temp 98.4 F (36.9 C) (Oral)   Resp 18   SpO2 99%   Visual Acuity Right Eye Distance:   Left Eye Distance:   Bilateral Distance:    Right Eye Near:   Left Eye Near:    Bilateral Near:     Physical Exam Constitutional:      General: She is not in acute distress.    Appearance: She is well-developed.  Cardiovascular:     Rate and Rhythm: Normal rate.  Pulmonary:     Effort: Pulmonary effort is normal.  Musculoskeletal:     Comments: Moving extremities without apparent difficulty; no redness  swelling or warmth on exam; no deformities noted   Skin:    General: Skin is warm and dry.  Neurological:     Mental Status: She is alert and oriented to person, place, and time.      UC Treatments / Results  Labs (all labs ordered are listed, but only abnormal results are displayed) Labs Reviewed - No data to display  EKG   Radiology No results found.  Procedures Procedures (including critical care time)  Medications Ordered in UC Medications  ketorolac (TORADOL) 30 MG/ML injection 15 mg (has no administration in time range)    Initial Impression / Assessment and Plan / UC Course  I have reviewed the triage vital signs and the nursing notes.  Pertinent labs & imaging results that were available during my care of the patient were reviewed by me and considered in my medical decision making (see chart for details).     Patient presents to urgent care with recurrent joint pain. Likely arthritis. Requesting injection and prednisone here today. May need more long term nsaid for pain. Encouraged establish with PCP for long term management of her symptoms, she expresses concern about her insurance coverage.  Final Clinical Impressions(s) / UC Diagnoses   Final diagnoses:  Arthralgia, unspecified joint  Arthritis     Discharge Instructions     Prednisone taper may be started today, take three pills for 3 days, 2 pills for 3 days, and 1 pill for 3 days.  Flexeril as needed at night time as a muscle relaxer.  Please establish with a primary care provider for long term management of your pain.  I have listed a clinic that will accept your insurance.    ??????????? ?????????? ???? ?????? ?????, ???????? ??? ?????? ????? 3 ????, 2 ??????? ????? 3 ???? ? 1 ??????? ????? 3 ????. ???????? ?? ??????? ???? ??? ?????????? ??????. ?????? ??? ?? ?? ???????? ?????? ???????? ??????????? ??????? ?? ????????? ?????? ????? ????. ????? ??? ??????? ???? ?? ?????????? ????  ?????????. Konzumiranje prednizona moze po?eti danas, uzimajte tri pilule tokom 3 dana, 2 tablete tokom 3 dana i 1 tabletu tokom 3 dana. Flekeril po potrebi noc?u kao relaksator misic?a. Molimo vas da se obratite lekaru primarne zdravstvene zastite za dugoro?no le?enje vaseg bola. Naveo sam kliniku koja c?e prihvatiti vase osiguranje.    ED  Prescriptions    Medication Sig Dispense Auth. Provider   cyclobenzaprine (FLEXERIL) 5 MG tablet Take 1 tablet (5 mg total) by mouth at bedtime. 15 tablet Linus Mako B, NP   predniSONE (DELTASONE) 20 MG tablet 3-3-3-2-2-2-1-1-1 18 tablet Linus Mako B, NP     PDMP not reviewed this encounter.   Georgetta Haber, NP 04/13/21 1733

## 2021-04-13 NOTE — ED Notes (Signed)
AVS reviewed with assistance from [employer] interpreter.

## 2021-04-13 NOTE — Discharge Instructions (Signed)
Prednisone taper may be started today, take three pills for 3 days, 2 pills for 3 days, and 1 pill for 3 days.  Flexeril as needed at night time as a muscle relaxer.  Please establish with a primary care provider for long term management of your pain.  I have listed a clinic that will accept your insurance.    ??????????? ?????????? ???? ?????? ?????, ???????? ??? ?????? ????? 3 ????, 2 ??????? ????? 3 ???? ? 1 ??????? ????? 3 ????. ???????? ?? ??????? ???? ??? ?????????? ??????. ?????? ??? ?? ?? ???????? ?????? ???????? ??????????? ??????? ?? ????????? ?????? ????? ????. ????? ??? ??????? ???? ?? ?????????? ???? ?????????. Konzumiranje prednizona moze po?eti danas, uzimajte tri pilule tokom 3 dana, 2 tablete tokom 3 dana i 1 tabletu tokom 3 dana. Flekeril po potrebi noc?u kao relaksator misic?a. Molimo vas da se obratite lekaru primarne zdravstvene zastite za dugoro?no le?enje vaseg bola. Naveo sam kliniku koja c?e prihvatiti vase osiguranje.

## 2021-04-13 NOTE — ED Triage Notes (Signed)
Pt presents with body/ joint pain xs 10 days. States the last time this happened, she was given steroids and and injection.

## 2021-04-22 ENCOUNTER — Encounter: Payer: Self-pay | Admitting: *Deleted

## 2021-04-26 ENCOUNTER — Ambulatory Visit (HOSPITAL_COMMUNITY)
Admission: EM | Admit: 2021-04-26 | Discharge: 2021-04-26 | Disposition: A | Discharge status: Home / Self Care | Payer: 59 | Attending: Student | Admitting: Student

## 2021-04-26 ENCOUNTER — Encounter (HOSPITAL_COMMUNITY): Payer: Self-pay

## 2021-04-26 DIAGNOSIS — J301 Allergic rhinitis due to pollen: Secondary | ICD-10-CM

## 2021-04-26 DIAGNOSIS — J320 Chronic maxillary sinusitis: Secondary | ICD-10-CM

## 2021-04-26 DIAGNOSIS — M940 Chondrocostal junction syndrome [Tietze]: Secondary | ICD-10-CM | POA: Diagnosis not present

## 2021-04-26 MED ORDER — AZITHROMYCIN 250 MG PO TABS: 250.0000 mg | ORAL_TABLET | Freq: Every day | ORAL | 0 refills | Status: AC

## 2021-04-26 MED ORDER — PREDNISONE 10 MG (21) PO TBPK: ORAL_TABLET | Freq: Every day | ORAL | 0 refills | Status: DC

## 2021-04-26 NOTE — Discharge Instructions (Addendum)
-  Prednisone taper for cough/bronchitis. I recommend taking this in the morning as it could give you energy.  -Azithromycin for sinusitis. -Your rib pain is a musculoskeletal issue that will resolve on its own.  Ibuprofen for discomfort. -Seek additional immediate medical attention if you develop left-sided chest pain, dizziness, shortness of breath, worse headache of life.

## 2021-04-26 NOTE — ED Triage Notes (Addendum)
Pt reports headache, rib cage pain and left ear pain x 1 day. Pt has not taken any OTC for complaints.   Pt requested refill of erythromycin and prednisone. States her PCP prescribed this medications every 3 months.

## 2021-04-26 NOTE — ED Provider Notes (Signed)
[facility]    CSN: [id number] Arrival date & time: 04/26/21  1820      History   Chief Complaint Chief Complaint  Patient presents with  . Headache  . Otalgia    HPI Loretta Copeland is a 65 y.o. female presenting with acute exacerbation of chronic sinusitis, rib cage pain, ear pressure.  Medical history chronic sinusitis with history of sinus surgery per patient.  Describes 3 days of exacerbation of her chronic sinusitis symptoms.  Nasal congestion, postnasal drip, facial pressure below her eyes.  Some left ear pressure as well, but denies pain, pressure, tinnitus, dizziness.  Denies other URI symptoms including cough, fever/chills, N/V/D.  She also notes some rib cage pain just below her left armpit, she states this is a chronic issue for her that typically resolves on its own.  Denies recent overuse, trauma, cough.  Has not taken any medications for this.  Adamantly denies any left-sided chest pain, dizziness, shortness of breath, pain down left arm.  Denies history of cardiac disease. She declines translation services as she does speak passable [place].  HPI  Past Medical History:  Diagnosis Date  . Arthritis   . Depression   . Migraines   . Thrombophlebitis 11/2016    Patient Active Problem List   Diagnosis Date Noted  . Thrombophlebitis 11/15/2016  . Sinusitis, chronic 09/07/2016  . Smoking 11/06/2015    History reviewed. No pertinent surgical history.  OB History   No obstetric history on file.      Home Medications    Prior to Admission medications   Medication Sig Start Date End Date Taking? Authorizing Provider  azithromycin (ZITHROMAX Z-PAK) 250 MG tablet Take 1 tablet (250 mg total) by mouth daily for 6 days. Z-Pak, 2 pills on day 1, 1 pill/day on days 2 through 5 04/26/21 05/02/21 Yes Rhys Martini, PA-C  predniSONE (STERAPRED UNI-PAK 21 TAB) 10 MG (21) TBPK tablet Take by mouth daily. Take 6 tabs by mouth daily  for 2 days, then 5 tabs for 2  days, then 4 tabs for 2 days, then 3 tabs for 2 days, 2 tabs for 2 days, then 1 tab by mouth daily for 2 days 04/26/21  Yes Cheree Ditto, Lyman Speller, PA-C  albuterol (PROVENTIL HFA;VENTOLIN HFA) 108 (90 Base) MCG/ACT inhaler Inhale 2 puffs into the lungs every 4 (four) hours as needed for wheezing or shortness of breath. 12/10/18   Mardella Layman, MD  cetirizine (ZYRTEC ALLERGY) 10 MG tablet Take 1 tablet (10 mg total) by mouth daily. 08/21/20   Wallis Bamberg, PA-C  cyclobenzaprine (FLEXERIL) 5 MG tablet Take 1 tablet (5 mg total) by mouth at bedtime. 04/13/21   Linus Mako B, NP  diclofenac sodium (VOLTAREN) 1 % GEL Apply 2 g topically 4 (four) times daily. 05/04/16   [name], MD  erythromycin (E-MYCIN) 250 MG tablet Take 1 tablet (250 mg total) by mouth 4 (four) times daily. 02/01/21   Zadie Rhine, MD  fluticasone (FLONASE) 50 MCG/ACT nasal spray Place 1 spray into both nostrils daily. 08/25/18 08/21/20  Dahlia Byes A, NP  rivaroxaban (XARELTO) 10 MG TABS tablet Take 1 tablet (10 mg total) by mouth daily. 12/07/16 01/17/20  Eyvonne Mechanic, PA-C    Family History Family History  Problem Relation Age of Onset  . Diabetes Mother   . Diabetes Father   . Diabetes Brother   . Diabetes Daughter     Social History Social History   Tobacco Use  . Smoking  status: Current Some Day Smoker    Packs/day: 1.00    Years: 30.00    Pack years: 30.00    Types: Cigarettes  . Smokeless tobacco: Never Used  . Tobacco comment: not smoking every day  Vaping Use  . Vaping Use: Never used  Substance Use Topics  . Alcohol use: No    Alcohol/week: 0.0 standard drinks  . Drug use: No     Allergies   Penicillins and Tetracyclines & related   Review of Systems Review of Systems  Constitutional: Negative for appetite change, chills and fever.  HENT: Positive for congestion, sinus pressure and sinus pain. Negative for ear pain, rhinorrhea, sore throat, tinnitus, trouble swallowing and voice change.    Eyes: Negative for redness and visual disturbance.  Respiratory: Negative for cough, chest tightness, shortness of breath and wheezing.   Cardiovascular: Negative for chest pain and palpitations.  Gastrointestinal: Negative for abdominal pain, constipation, diarrhea, nausea and vomiting.  Genitourinary: Negative for dysuria, frequency and urgency.  Musculoskeletal: Negative for myalgias.       Rib cage pain  Neurological: Negative for dizziness, weakness and headaches.  Psychiatric/Behavioral: Negative for confusion.  All other systems reviewed and are negative.    Physical Exam Triage Vital Signs ED Triage Vitals  Enc Vitals Group     BP 04/26/21 1947 140/71     Pulse Rate 04/26/21 1947 78     Resp 04/26/21 1947 20     Temp 04/26/21 1947 98.6 F (37 C)     Temp Source 04/26/21 1947 Oral     SpO2 04/26/21 1947 99 %     Weight --      Height --      Head Circumference --      Peak Flow --      Pain Score 04/26/21 1935 8     Pain Loc --      Pain Edu? --      Excl. in GC? --    No data found.  Updated Vital Signs BP 140/71 (BP Location: Right Arm)   Pulse 78   Temp 98.6 F (37 C) (Oral)   Resp 20   SpO2 99%   Visual Acuity Right Eye Distance:   Left Eye Distance:   Bilateral Distance:    Right Eye Near:   Left Eye Near:    Bilateral Near:     Physical Exam Vitals reviewed.  Constitutional:      General: She is not in acute distress.    Appearance: Normal appearance. She is not ill-appearing.  HENT:     Head: Normocephalic and atraumatic.     Right Ear: Hearing, tympanic membrane, ear canal and external ear normal. No swelling or tenderness. There is no impacted cerumen. No mastoid tenderness. Tympanic membrane is not perforated, erythematous, retracted or bulging.     Left Ear: Hearing, tympanic membrane, ear canal and external ear normal. No swelling or tenderness. There is no impacted cerumen. No mastoid tenderness. Tympanic membrane is not perforated,  erythematous, retracted or bulging.     Nose:     Right Sinus: Maxillary sinus tenderness present. No frontal sinus tenderness.     Left Sinus: Maxillary sinus tenderness present. No frontal sinus tenderness.     Mouth/Throat:     Mouth: Mucous membranes are moist.     Pharynx: Uvula midline. No oropharyngeal exudate or posterior oropharyngeal erythema.     Tonsils: No tonsillar exudate.  Cardiovascular:     Rate and Rhythm:  Normal rate and regular rhythm.     Heart sounds: Normal heart sounds.  Pulmonary:     Breath sounds: Normal breath sounds and air entry. No wheezing, rhonchi or rales.  Chest:     Chest wall: No tenderness.     Comments: 2x2cm area of reproducible pain about 3 cm distal to left axilla, along L posterior ribs. No bony deformity, effusion, ecchymosis. Abdominal:     General: Abdomen is flat. Bowel sounds are normal.     Tenderness: There is no abdominal tenderness. There is no guarding or rebound.  Lymphadenopathy:     Cervical: No cervical adenopathy.  Neurological:     General: No focal deficit present.     Mental Status: She is alert and oriented to person, place, and time.  Psychiatric:        Attention and Perception: Attention and perception normal.        Mood and Affect: Mood and affect normal.        Behavior: Behavior normal. Behavior is cooperative.        Thought Content: Thought content normal.        Judgment: Judgment normal.      UC Treatments / Results  Labs (all labs ordered are listed, but only abnormal results are displayed) Labs Reviewed - No data to display  EKG   Radiology No results found.  Procedures Procedures (including critical care time)  Medications Ordered in UC Medications - No data to display  Initial Impression / Assessment and Plan / UC Course  I have reviewed the triage vital signs and the nursing notes.  Pertinent labs & imaging results that were available during my care of the patient were reviewed by me  and considered in my medical decision making (see chart for details).     This patient is a 65 year old female presenting with acute exacerbation of chronic sinusitis, costochondritis. Today this pt is afebrile nontachycardic nontachypneic, oxygenating well on room air, no wheezes rhonchi or rales.   This patient is penicillin and tetracycline allergic, so sent Z-Pak as below.  Prednisone sent at patient request.  For costochondritis, this pain is completely reproducible and of patient's posterior left side.  No red flag symptoms including dizziness, left-sided chest pain, shortness of breath.  No history of cardiac disease.  For allergic rhinitis component, continue Zyrtec.  ED return precautions discussed.  Patient declines translation services.  Final Clinical Impressions(s) / UC Diagnoses   Final diagnoses:  Seasonal allergic rhinitis due to pollen  Chronic maxillary sinusitis  Costochondritis     Discharge Instructions     -Prednisone taper for cough/bronchitis. I recommend taking this in the morning as it could give you energy.  -Azithromycin for sinusitis. -Your rib pain is a musculoskeletal issue that will resolve on its own.  Ibuprofen for discomfort. -Seek additional immediate medical attention if you develop left-sided chest pain, dizziness, shortness of breath, worse headache of life.     ED Prescriptions    Medication Sig Dispense Auth. Provider   azithromycin (ZITHROMAX Z-PAK) 250 MG tablet Take 1 tablet (250 mg total) by mouth daily for 6 days. Z-Pak, 2 pills on day 1, 1 pill/day on days 2 through 5 6 tablet Rhys Martini, PA-C   predniSONE (STERAPRED UNI-PAK 21 TAB) 10 MG (21) TBPK tablet Take by mouth daily. Take 6 tabs by mouth daily  for 2 days, then 5 tabs for 2 days, then 4 tabs for 2 days, then 3 tabs for 2  days, 2 tabs for 2 days, then 1 tab by mouth daily for 2 days 42 tablet Rhys MartiniGraham, Dhana Totton E, PA-C     PDMP not reviewed this encounter.   Rhys MartiniGraham,  Zion Ta E, PA-C 04/27/21 [phone]

## 2021-08-08 ENCOUNTER — Emergency Department (HOSPITAL_COMMUNITY)
Admission: EM | Admit: 2021-08-08 | Discharge: 2021-08-09 | Disposition: A | Discharge status: Home / Self Care | Payer: 59 | Attending: Emergency Medicine | Admitting: Emergency Medicine

## 2021-08-08 ENCOUNTER — Other Ambulatory Visit: Payer: Self-pay

## 2021-08-08 ENCOUNTER — Encounter (HOSPITAL_COMMUNITY): Payer: Self-pay

## 2021-08-08 DIAGNOSIS — R0602 Shortness of breath: Secondary | ICD-10-CM | POA: Insufficient documentation

## 2021-08-08 DIAGNOSIS — R3911 Hesitancy of micturition: Secondary | ICD-10-CM | POA: Diagnosis not present

## 2021-08-08 DIAGNOSIS — F17210 Nicotine dependence, cigarettes, uncomplicated: Secondary | ICD-10-CM | POA: Diagnosis not present

## 2021-08-08 DIAGNOSIS — R112 Nausea with vomiting, unspecified: Secondary | ICD-10-CM | POA: Insufficient documentation

## 2021-08-08 DIAGNOSIS — R1032 Left lower quadrant pain: Secondary | ICD-10-CM | POA: Diagnosis not present

## 2021-08-08 DIAGNOSIS — M545 Low back pain, unspecified: Secondary | ICD-10-CM | POA: Insufficient documentation

## 2021-08-08 DIAGNOSIS — R140 Abdominal distension (gaseous): Secondary | ICD-10-CM | POA: Diagnosis not present

## 2021-08-08 DIAGNOSIS — R300 Dysuria: Secondary | ICD-10-CM | POA: Insufficient documentation

## 2021-08-08 DIAGNOSIS — Z7901 Long term (current) use of anticoagulants: Secondary | ICD-10-CM | POA: Diagnosis not present

## 2021-08-08 DIAGNOSIS — R509 Fever, unspecified: Secondary | ICD-10-CM | POA: Insufficient documentation

## 2021-08-08 DIAGNOSIS — I82412 Acute embolism and thrombosis of left femoral vein: Secondary | ICD-10-CM | POA: Insufficient documentation

## 2021-08-08 DIAGNOSIS — R202 Paresthesia of skin: Secondary | ICD-10-CM | POA: Diagnosis not present

## 2021-08-08 LAB — CBC WITH DIFFERENTIAL/PLATELET
Abs Immature Granulocytes: 0.02 10*3/uL (ref 0.00–0.07)
Basophils Absolute: 0.1 10*3/uL (ref 0.0–0.1)
Basophils Relative: 1 %
Eosinophils Absolute: 0 10*3/uL (ref 0.0–0.5)
Eosinophils Relative: 0 %
HCT: 46.3 % — ABNORMAL HIGH (ref 36.0–46.0)
Hemoglobin: 14.9 g/dL (ref 12.0–15.0)
Immature Granulocytes: 0 %
Lymphocytes Relative: 27 %
Lymphs Abs: 1.8 10*3/uL (ref 0.7–4.0)
MCH: 28.9 pg (ref 26.0–34.0)
MCHC: 32.2 g/dL (ref 30.0–36.0)
MCV: 89.7 fL (ref 80.0–100.0)
Monocytes Absolute: 0.8 10*3/uL (ref 0.1–1.0)
Monocytes Relative: 11 %
Neutro Abs: 4.2 10*3/uL (ref 1.7–7.7)
Neutrophils Relative %: 61 %
Platelets: 238 10*3/uL (ref 150–400)
RBC: 5.16 MIL/uL — ABNORMAL HIGH (ref 3.87–5.11)
RDW: 13.5 % (ref 11.5–15.5)
WBC: 6.8 10*3/uL (ref 4.0–10.5)
nRBC: 0 % (ref 0.0–0.2)

## 2021-08-08 NOTE — ED Provider Notes (Signed)
[facility]-EMERGENCY DEPT Provider Note   CSN: [id number] Arrival date & time: 08/08/21  2154     History Chief Complaint  Patient presents with   Abdominal Pain    Loretta Copeland is a 65 y.o. female with a history of arthritis, depression, sinusitis, thrombophlebitis who presents the emergency department with a chief complaint of abdominal pain.  The patient reports a history of intermittent left lower quadrant abdominal pain for the last 2 months.  She is unable to characterize the pain.  She reports that the pain is frequently associated with abdominal distention and left-sided low back pain.  Over the last 2 weeks, she began having intermittent fever up to 101 at home, nausea, infrequent episodes of nonbloody, nonbilious vomiting, dysuria, and urinary hesitancy.  She reports that she is very concerned by the distention in her abdomen.  It does wax and wane, but does not resolve.   She denies diarrhea, constipation, vaginal bleeding or discharge, flank pain, hematuria, shortness of breath, rash rectal pain, hematochezia, melena.   She is a current, every day smoker.  She also adds that she has been having numbness and tingling to her bilateral hands and feet for several months, but the symptoms have not worsened and she is currently being followed by her PCP for this work-up.  The history is provided by the patient and medical records. The history is limited by a language barrier. A language interpreter was used (Patient declined interpreter through [place] language interpreters and requested to use her daughter.).     Past Medical History:  Diagnosis Date   Arthritis    Depression    Migraines    Thrombophlebitis 11/2016    Patient Active Problem List   Diagnosis Date Noted   Thrombophlebitis 11/15/2016   Sinusitis, chronic 09/07/2016   Smoking 11/06/2015    History reviewed. No pertinent surgical history.   OB History   No obstetric history on  file.     Family History  Problem Relation Age of Onset   Diabetes Mother    Diabetes Father    Diabetes Brother    Diabetes Daughter     Social History   Tobacco Use   Smoking status: Some Days    Packs/day: 1.00    Years: 30.00    Pack years: 30.00    Types: Cigarettes   Smokeless tobacco: Never   Tobacco comments:    not smoking every day  Vaping Use   Vaping Use: Never used  Substance Use Topics   Alcohol use: No    Alcohol/week: 0.0 standard drinks   Drug use: No    Home Medications Prior to Admission medications   Medication Sig Start Date End Date Taking? Authorizing Provider  albuterol (PROVENTIL HFA;VENTOLIN HFA) 108 (90 Base) MCG/ACT inhaler Inhale 2 puffs into the lungs every 4 (four) hours as needed for wheezing or shortness of breath. 12/10/18   Mardella Layman, MD  cetirizine (ZYRTEC ALLERGY) 10 MG tablet Take 1 tablet (10 mg total) by mouth daily. 08/21/20   Wallis Bamberg, PA-C  cyclobenzaprine (FLEXERIL) 5 MG tablet Take 1 tablet (5 mg total) by mouth at bedtime. 04/13/21   Linus Mako B, NP  diclofenac sodium (VOLTAREN) 1 % GEL Apply 2 g topically 4 (four) times daily. 05/04/16   [name], MD  erythromycin (E-MYCIN) 250 MG tablet Take 1 tablet (250 mg total) by mouth 4 (four) times daily. 02/01/21   Zadie Rhine, MD  predniSONE (STERAPRED UNI-PAK 21 TAB) 10  MG (21) TBPK tablet Take by mouth daily. Take 6 tabs by mouth daily  for 2 days, then 5 tabs for 2 days, then 4 tabs for 2 days, then 3 tabs for 2 days, 2 tabs for 2 days, then 1 tab by mouth daily for 2 days 04/26/21   Rhys Martini, PA-C  fluticasone [facility]) 50 MCG/ACT nasal spray Place 1 spray into both nostrils daily. 08/25/18 08/21/20  Dahlia Byes A, NP  rivaroxaban (XARELTO) 10 MG TABS tablet Take 1 tablet (10 mg total) by mouth daily. 12/07/16 01/17/20  Hedges, Tinnie Gens, PA-C    Allergies    Penicillins and Tetracyclines & related  Review of Systems   Review of Systems  Constitutional:   Positive for fever. Negative for activity change, chills and diaphoresis.  HENT:  Negative for congestion and sore throat.   Eyes:  Negative for redness and visual disturbance.  Respiratory:  Negative for cough, shortness of breath and wheezing.   Cardiovascular:  Negative for chest pain and palpitations.  Gastrointestinal:  Positive for abdominal distention, abdominal pain, nausea and vomiting. Negative for anal bleeding, blood in stool, constipation and diarrhea.  Genitourinary:  Negative for dysuria.  Musculoskeletal:  Negative for back pain, myalgias, neck pain and neck stiffness.  Skin:  Negative for rash.  Allergic/Immunologic: Negative for immunocompromised state.  Neurological:  Negative for dizziness, speech difficulty, weakness, light-headedness, numbness and headaches.  Psychiatric/Behavioral:  Negative for confusion.    Physical Exam Updated Vital Signs BP 139/71   Pulse 84   Temp 98.5 F (36.9 C) (Oral)   Resp 15   Ht 5\' 2"  (1.575 m)   Wt 65 kg   SpO2 96%   BMI 26.21 kg/m   Physical Exam Vitals and nursing note reviewed.  Constitutional:      General: She is not in acute distress. HENT:     Head: Normocephalic.  Eyes:     Conjunctiva/sclera: Conjunctivae normal.  Cardiovascular:     Rate and Rhythm: Regular rhythm. Tachycardia present.     Heart sounds: No murmur heard.   No friction rub. No gallop.  Pulmonary:     Effort: Pulmonary effort is normal. No respiratory distress.     Breath sounds: No stridor. No wheezing, rhonchi or rales.  Chest:     Chest wall: No tenderness.  Abdominal:     General: There is no distension.     Palpations: Abdomen is soft. There is no mass.     Tenderness: There is abdominal tenderness. There is no right CVA tenderness, left CVA tenderness, guarding or rebound.     Hernia: No hernia is present.     Comments: Minimal tenderness to palpation in the left lower quadrant and suprapubic region.  No palpable masses organomegaly.   No CVA tenderness bilaterally.  No tenderness over McBurney's point.  Negative Murphy sign.  Normoactive bowel sounds.  No abdominal distention noted on my exam.  Musculoskeletal:     Cervical back: Neck supple.  Skin:    General: Skin is warm.     Findings: No rash.  Neurological:     Mental Status: She is alert.  Psychiatric:        Behavior: Behavior normal.    ED Results / Procedures / Treatments   Labs (all labs ordered are listed, but only abnormal results are displayed) Labs Reviewed  CBC WITH DIFFERENTIAL/PLATELET - Abnormal; Notable for the following components:      Result Value   RBC 5.16 (*)  HCT 46.3 (*)    All other components within normal limits  COMPREHENSIVE METABOLIC PANEL - Abnormal; Notable for the following components:   BUN 7 (*)    Albumin 3.3 (*)    All other components within normal limits  URINALYSIS, ROUTINE W REFLEX MICROSCOPIC - Abnormal; Notable for the following components:   APPearance HAZY (*)    Leukocytes,Ua LARGE (*)    Bacteria, UA RARE (*)    All other components within normal limits  URINE CULTURE  LIPASE, BLOOD    EKG None  Radiology CT ABDOMEN PELVIS W CONTRAST  Result Date: 08/09/2021 CLINICAL DATA:  Left lower quadrant abdominal pain, abdominal distension EXAM: CT ABDOMEN AND PELVIS WITH CONTRAST TECHNIQUE: Multidetector CT imaging of the abdomen and pelvis was performed using the standard protocol following bolus administration of intravenous contrast. CONTRAST:  56mL OMNIPAQUE IOHEXOL 350 MG/ML SOLN COMPARISON:  None. FINDINGS: Lower chest: No acute abnormality. Hepatobiliary: Cholelithiasis without pericholecystic inflammatory change. Liver unremarkable. No intra or extrahepatic biliary ductal dilation. Pancreas: Unremarkable Spleen: Unremarkable Adrenals/Urinary Tract: The adrenal glands are unremarkable. The kidneys are normal in size and position. Simple cortical cyst noted within the lower pole the right kidney. The kidneys  are otherwise unremarkable. Bladder unremarkable. Stomach/Bowel: Mild sigmoid diverticulosis. The stomach, small bowel, and large bowel are otherwise unremarkable. Appendix normal. No free intraperitoneal gas or fluid. Vascular/Lymphatic: Mild atherosclerotic calcification within the abdominal aorta. No aortic aneurysm. There is a central filling defect seen within the left common femoral vein suspicious for acute DVT. Less likely, this may represent mixing artifact from inflow of unopacified blood. No pathologic adenopathy within the abdomen and pelvis. Reproductive: Uterus and bilateral adnexa are unremarkable. Other: No abdominal wall hernia.  Rectum unremarkable. Musculoskeletal: No acute bone abnormality. Remote superior endplate fracture of L3. No lytic or blastic bone lesion. IMPRESSION: Mixing artifact versus acute DVT within the left common femoral vein. Correlation for history of DVT is recommended. Lower extremity Doppler sonography may be helpful for further evaluation. No acute intra-abdominal pathology identified. No definite radiographic explanation for the patient's reported symptoms. Cholelithiasis. Mild sigmoid diverticulosis. Aortic Atherosclerosis (ICD10-I70.0). Electronically Signed   By: Helyn Numbers M.D.   On: 08/09/2021 03:17    Procedures Procedures   Medications Ordered in ED Medications  iohexol (OMNIPAQUE) 350 MG/ML injection 100 mL (80 mLs Intravenous Contrast Given 08/09/21 0300)    ED Course  I have reviewed the triage vital signs and the nursing notes.  Pertinent labs & imaging results that were available during my care of the patient were reviewed by me and considered in my medical decision making (see chart for details).     MDM Rules/Calculators/A&P                         {Remember to document critical care time when appropriate:  65 year old female with a history of arthritis, depression, sinusitis, thrombophlebitis who presents to the emergency department  with a 20-month history of left lower quadrant abdominal pain, intermittent abdominal distention, and left-sided low back pain.  Over the last 2 weeks, she has developed intermittent fever, vomiting, dysuria, and urinary hesitancy.  History is limited as patient declines a certified medical interpreter and wishes to use her daughter via phone.   Mild tachycardia on arrival.  Vital signs are otherwise stable.  Abdomen is benign.  Will order labs and urinalysis and reassess.  The patient was discussed with Dr. Madilyn Hook, attending physician.   UA with  leukocytes, but not concerning for infection.  No metabolic derangements.  CBC is overall unremarkable.  CT abdomen pelvis with concern for acute DVT within the left common femoral vein versus mixing artifact.  Although initially, the patient did not mention history of VTE, and this could have been a rule out due to language barrier, but now the patient's daughter reports that the patient has a history of blood clots and was on blood thinners, but this has since been discontinued.  Given concern for history of VTE, will order venous duplex.  Patient care transferred to PA Sponkeller at the end of my shift to follow up on DVT study. Patient presentation, ED course, and plan of care discussed with review of all pertinent labs and imaging. Please see his/her note for further details regarding further ED course and disposition.  Final Clinical Impression(s) / ED Diagnoses Final diagnoses:  None    Rx / DC Orders ED Discharge Orders     None        Barkley BoardsMcDonald, Criss Bartles A, PA-C 08/09/21 0711    Tilden Fossaees, Elizabeth, MD 08/09/21 [phone]

## 2021-08-08 NOTE — ED Triage Notes (Signed)
Abd pain with distention, nausea numbness and pain to bilateral extremities x3 months, intermittent fever.

## 2021-08-09 ENCOUNTER — Emergency Department (HOSPITAL_COMMUNITY): Payer: 59

## 2021-08-09 ENCOUNTER — Encounter (HOSPITAL_COMMUNITY): Payer: Self-pay | Admitting: Emergency Medicine

## 2021-08-09 ENCOUNTER — Emergency Department (HOSPITAL_COMMUNITY)
Admission: EM | Admit: 2021-08-09 | Discharge: 2021-08-09 | Discharge status: Against Medical Advice | Payer: 59 | Source: Home / Self Care | Attending: Emergency Medicine | Admitting: Emergency Medicine

## 2021-08-09 ENCOUNTER — Emergency Department (HOSPITAL_BASED_OUTPATIENT_CLINIC_OR_DEPARTMENT_OTHER): Payer: 59

## 2021-08-09 ENCOUNTER — Encounter (HOSPITAL_COMMUNITY): Payer: Self-pay

## 2021-08-09 DIAGNOSIS — I82432 Acute embolism and thrombosis of left popliteal vein: Secondary | ICD-10-CM

## 2021-08-09 DIAGNOSIS — I82442 Acute embolism and thrombosis of left tibial vein: Secondary | ICD-10-CM

## 2021-08-09 DIAGNOSIS — I82409 Acute embolism and thrombosis of unspecified deep veins of unspecified lower extremity: Secondary | ICD-10-CM | POA: Insufficient documentation

## 2021-08-09 DIAGNOSIS — R0602 Shortness of breath: Secondary | ICD-10-CM | POA: Insufficient documentation

## 2021-08-09 DIAGNOSIS — Z7901 Long term (current) use of anticoagulants: Secondary | ICD-10-CM | POA: Insufficient documentation

## 2021-08-09 DIAGNOSIS — F17210 Nicotine dependence, cigarettes, uncomplicated: Secondary | ICD-10-CM | POA: Insufficient documentation

## 2021-08-09 HISTORY — DX: Acute embolism and thrombosis of unspecified deep veins of unspecified lower extremity: I82.409

## 2021-08-09 LAB — URINALYSIS, ROUTINE W REFLEX MICROSCOPIC
Bilirubin Urine: NEGATIVE
Glucose, UA: NEGATIVE mg/dL
Hgb urine dipstick: NEGATIVE
Ketones, ur: NEGATIVE mg/dL
Nitrite: NEGATIVE
Protein, ur: NEGATIVE mg/dL
Specific Gravity, Urine: 1.012 (ref 1.005–1.030)
pH: 6 (ref 5.0–8.0)

## 2021-08-09 LAB — COMPREHENSIVE METABOLIC PANEL
ALT: 14 U/L (ref 0–44)
AST: 18 U/L (ref 15–41)
Albumin: 3.3 g/dL — ABNORMAL LOW (ref 3.5–5.0)
Alkaline Phosphatase: 79 U/L (ref 38–126)
Anion gap: 8 (ref 5–15)
BUN: 7 mg/dL — ABNORMAL LOW (ref 8–23)
CO2: 27 mmol/L (ref 22–32)
Calcium: 9.4 mg/dL (ref 8.9–10.3)
Chloride: 106 mmol/L (ref 98–111)
Creatinine, Ser: 0.73 mg/dL (ref 0.44–1.00)
GFR, Estimated: >60 mL/min (ref >60)
Glucose, Bld: 90 mg/dL (ref 70–99)
Potassium: 4.3 mmol/L (ref 3.5–5.1)
Sodium: 141 mmol/L (ref 135–145)
Total Bilirubin: 0.4 mg/dL (ref 0.3–1.2)
Total Protein: 6.9 g/dL (ref 6.5–8.1)

## 2021-08-09 LAB — LIPASE, BLOOD: Lipase: 38 U/L (ref 11–51)

## 2021-08-09 MED ORDER — APIXABAN 5 MG PO TABS
10.0000 mg | ORAL_TABLET | Freq: Once | ORAL | Status: AC
  Administered 2021-08-09: 10 mg via ORAL
  Filled 2021-08-09: qty 2

## 2021-08-09 MED ORDER — IOHEXOL 350 MG/ML SOLN
100.0000 mL | Freq: Once | INTRAVENOUS | Status: AC | PRN
  Administered 2021-08-09: 80 mL via INTRAVENOUS

## 2021-08-09 MED ORDER — APIXABAN (ELIQUIS) EDUCATION KIT FOR DVT/PE PATIENTS
PACK | Freq: Once | Status: AC
  Filled 2021-08-09: qty 1

## 2021-08-09 MED ORDER — APIXABAN 5 MG PO TABS: ORAL_TABLET | ORAL | 1 refills | Status: DC

## 2021-08-09 MED ORDER — IOHEXOL 350 MG/ML SOLN: 75.0000 mL | Freq: Once | INTRAVENOUS | Status: DC | PRN

## 2021-08-09 NOTE — Discharge Instructions (Addendum)
Thank you for allowing me to care for you today in the Emergency Department.   The ultrasound of your leg was consistent with a large blot clot extending from your left hip all the way down your left leg.  It was strongly recommended that you remain in the hospital for further medical evaluation of why you may be forming clots, however you refused admission to the hospital.  You have been prescribed a blood thinning medication called Eliquis to take twice a day as prescribed.  Please follow-up with your primary care doctor in the outpatient setting to discuss refills of your blood thinner.  It is extremely important that you are compliant with this medication.  See the education information below. Return to the ED if you develop any of the symptoms listed below, shortness of breath, or any other new severe symptoms.   Information on my medicine - ELIQUIS (apixaban)  Why was Eliquis prescribed for you? Eliquis was prescribed to treat blood clots that may have been found in the veins of your legs (deep vein thrombosis) or in your lungs (pulmonary embolism) and to reduce the risk of them occurring again.  What do You need to know about Eliquis ? The starting dose is 10 mg (two 5 mg tablets) taken TWICE daily for the FIRST SEVEN (7) DAYS, then on (enter date)  08/16/2021  the dose is reduced to ONE 5 mg tablet taken TWICE daily.  Eliquis may be taken with or without food.   Try to take the dose about the same time in the morning and in the evening. If you have difficulty swallowing the tablet whole please discuss with your pharmacist how to take the medication safely.  Take Eliquis exactly as prescribed and DO NOT stop taking Eliquis without talking to the doctor who prescribed the medication.  Stopping may increase your risk of developing a new blood clot.  Refill your prescription before you run out.  After discharge, you should have regular check-up appointments with your healthcare provider that  is prescribing your Eliquis.    What do you do if you miss a dose? If a dose of ELIQUIS is not taken at the scheduled time, take it as soon as possible on the same day and twice-daily administration should be resumed. The dose should not be doubled to make up for a missed dose.  Important Safety Information A possible side effect of Eliquis is bleeding. You should call your healthcare provider right away if you experience any of the following: Bleeding from an injury or your nose that does not stop. Unusual colored urine (red or dark brown) or unusual colored stools (red or black). Unusual bruising for unknown reasons. A serious fall or if you hit your head (even if there is no bleeding).  Some medicines may interact with Eliquis and might increase your risk of bleeding or clotting while on Eliquis. To help avoid this, consult your healthcare provider or pharmacist prior to using any new prescription or non-prescription medications, including herbals, vitamins, non-steroidal anti-inflammatory drugs (NSAIDs) and supplements.  This website has more information on Eliquis (apixaban): http://www.eliquis.com/eliquis/home

## 2021-08-09 NOTE — ED Provider Notes (Signed)
  Physical Exam  BP 138/80 (BP Location: Right Arm)   Pulse 83   Temp 98.5 F (36.9 C) (Oral)   Resp 16   Ht 5\' 2"  (1.575 m)   Wt 65 kg   SpO2 99%   BMI 26.21 kg/m   Physical Exam  ED Course/Procedures   Clinical Course as of 08/09/21 1443  Thu Aug 09, 2021  0905 Vascular technician informed this provider that patient has DVT of the left lower extremity extending from the external iliac down through the lower leg.  [RS]    Clinical Course User Index [RS] Loretta Copeland, Aug 11, 2021, PA-C    Procedures  MDM   Care as patient is seen from preceding ED provider Loretta McDonald, PA-C at time of shift change.  Please see her associated note for further incident patient's ED course.  In brief this is a 65 year old female with history of DVT who speaks 71 who presents with concern for abdominal pain wax and wanes for the last 2 months with associated abdominal distention.  Additionally became febrile at home over the last week with T-max of 101, nausea, NBNB emesis, dysuria, and urinary hesitancy.NO recent travel/prolonged immobilization, surgeries, or malignancy. 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    Patient underwent basic laboratory studies.  CBC reassuring without leukocytosis, CMP unremarkable, UA nonconcerning for infection and lipase reassuring. CT of the abdomen pelvis which was performed in the ED revealed findings concerning for possible DVT within the left common femoral vein.  AT time of shift change, patient is awaiting vascular duplex study.  Venous duplex positive for Acute DVT extending from the left external iliac down through the entirety of the left leg, acutely the posterior tibial veins and left peroneal veins as well as superficial venous thrombosis the left saphenous vein.  Does appear to have some chronicity with DVT involving left popliteal and gastrocnemius veins as well as chronicity in the left common femoral vein and left  femoral vein.  Patient only speaks [place] and repeatedly refused [place].  Finally agreed to using daughter for assistance with discussion of her test results.  Extensive discussion regarding the severity of her clot burden in the left leg, as well as my medical recommendation that she be admitted to the hospital for hypercoagulable work-up.  Patient and her daughter both was understanding of her medical recommendations and concern for safety should she decide to be discharged home without further investigation into source of her clotting, however patient refusing to be admitted at this time.  Additionally there is no clinical concern for PE given lack of chest pain, shortness of breath, palpitations, tachycardia, tachypnea, or hypoxia.  Additionally patient refusing any further work-up in the ED at this time.  Will provide first dose of Eliquis in the emergency department and request pharmacy education for Eliquis prescription in the outpatient setting.  Will discharge with prescription for anticoagulation with Eliquis and close outpatient follow-up.  Loretta Copeland voiced understanding of her medical evaluation and treatment.  Each of her questions was answered to her expressed satisfaction.  She remains adamantly opposed to admission at this time. Strict return precautions were given.   This chart was dictated using voice recognition software, Dragon. Despite the best efforts of this provider to proofread and correct errors, errors may still occur which can change documentation meaning.    Nurse, learning disability, PA-C 08/09/21 1444    08/11/21, DO 08/09/21 1554

## 2021-08-09 NOTE — Discharge Instructions (Addendum)
Return for any problem.   If you change your mind and decide to get a CT scan to evaluate for a blood clot in your lung please return to the ED so that we can help you.  Please take the Eliquis as previously prescribed.

## 2021-08-09 NOTE — ED Notes (Signed)
Patient is refusing vitals. Pt states she wants to go home. Pt said "doctor said I can go home"

## 2021-08-09 NOTE — ED Triage Notes (Signed)
Per patient/daughter, states she was here yesterday and into this am-prescribed Eliquis for DVT-daughter called back to ED and spoke to writer-states she became SOB after taking med-advised patient to come back for evaluation-PE vs allergic reaction

## 2021-08-09 NOTE — ED Notes (Signed)
[place] in room. Vitals will be updated once done.

## 2021-08-09 NOTE — Progress Notes (Signed)
LLE venous duplex has been completed.  Preliminary findings given to Loleta Dicker, PA.  Results can be found under chart review under CV PROC. 08/09/2021 9:39 AM Hendy Brindle RVT, RDMS

## 2021-08-09 NOTE — ED Notes (Addendum)
Patient refused vital signs. Patient left before discharge papers could be given

## 2021-08-09 NOTE — ED Provider Notes (Signed)
[facility]-EMERGENCY DEPT Provider Note   CSN: [id number] Arrival date & time: 08/09/21  1435     History Chief Complaint  Patient presents with   Shortness of Breath    Loretta Copeland is a 65 y.o. female.  65 year old female with prior medical history detailed below presents for evaluation.  Patient was seen earlier today and diagnosed with DVT.  Patient refused additional work-up at prior evaluation.  She specifically declined CT evaluation for possible PE.  The patient went home and apparently developed shortness of breath.  She returns now for evaluation.  The patient is still refusing IV and CT evaluation for PE.  She has capacity to refuse care.  She is in no distress during exam.  Patient speaks [place] and [place].  She refused translator service.  She agreed to the use of her daughter as a Nurse, learning disability.  Despite the risks of missing a PE being explained to her the patient still refused evaluation for same.  The history is provided by the patient and a relative. A language interpreter was used.  Shortness of Breath Severity:  Mild Onset quality:  Unable to specify Timing:  Unable to specify Progression:  Resolved Chronicity:  New     Past Medical History:  Diagnosis Date   Arthritis    Depression    DVT (deep venous thrombosis) (HCC)    Migraines    Thrombophlebitis 11/2016    Patient Active Problem List   Diagnosis Date Noted   Thrombophlebitis 11/15/2016   Sinusitis, chronic 09/07/2016   Smoking 11/06/2015    History reviewed. No pertinent surgical history.   OB History   No obstetric history on file.     Family History  Problem Relation Age of Onset   Diabetes Mother    Diabetes Father    Diabetes Brother    Diabetes Daughter     Social History   Tobacco Use   Smoking status: Some Days    Packs/day: 1.00    Years: 30.00    Pack years: 30.00    Types: Cigarettes   Smokeless tobacco: Never   Tobacco comments:     not smoking every day  Vaping Use   Vaping Use: Never used  Substance Use Topics   Alcohol use: No    Alcohol/week: 0.0 standard drinks   Drug use: No    Home Medications Prior to Admission medications   Medication Sig Start Date End Date Taking? Authorizing Provider  albuterol (PROVENTIL HFA;VENTOLIN HFA) 108 (90 Base) MCG/ACT inhaler Inhale 2 puffs into the lungs every 4 (four) hours as needed for wheezing or shortness of breath. 12/10/18   Mardella Layman, MD  apixaban (ELIQUIS) 5 MG TABS tablet Take 2 tablets (10mg ) twice daily for 7 days, then 1 tablet (5mg ) twice daily 08/09/21   Sponseller, R, PA-C  cetirizine (ZYRTEC ALLERGY) 10 MG tablet Take 1 tablet (10 mg total) by mouth daily. 08/21/20   Lupe Carney, PA-C  cyclobenzaprine (FLEXERIL) 5 MG tablet Take 1 tablet (5 mg total) by mouth at bedtime. 04/13/21   Wallis Bamberg B, NP  diclofenac sodium (VOLTAREN) 1 % GEL Apply 2 g topically 4 (four) times daily. 05/04/16   Linus Mako, MD  erythromycin (E-MYCIN) 250 MG tablet Take 1 tablet (250 mg total) by mouth 4 (four) times daily. 02/01/21   [name], MD  predniSONE (STERAPRED UNI-PAK 21 TAB) 10 MG (21) TBPK tablet Take by mouth daily. Take 6 tabs by mouth daily  for 2  days, then 5 tabs for 2 days, then 4 tabs for 2 days, then 3 tabs for 2 days, 2 tabs for 2 days, then 1 tab by mouth daily for 2 days 04/26/21   Rhys Martini, PA-C  fluticasone [facility]) 50 MCG/ACT nasal spray Place 1 spray into both nostrils daily. 08/25/18 08/21/20  Dahlia Byes A, NP  rivaroxaban (XARELTO) 10 MG TABS tablet Take 1 tablet (10 mg total) by mouth daily. 12/07/16 01/17/20  Hedges, Tinnie Gens, PA-C    Allergies    Penicillins and Tetracyclines & related  Review of Systems   Review of Systems  Respiratory:  Positive for shortness of breath.   All other systems reviewed and are negative.  Physical Exam Updated Vital Signs BP (!) 141/80   Pulse 90   Resp 19   SpO2 96%   Physical  Exam Vitals and nursing note reviewed.  Constitutional:      General: She is not in acute distress.    Appearance: Normal appearance. She is well-developed.  HENT:     Head: Normocephalic and atraumatic.  Eyes:     Conjunctiva/sclera: Conjunctivae normal.     Pupils: Pupils are equal, round, and reactive to light.  Cardiovascular:     Rate and Rhythm: Normal rate and regular rhythm.     Heart sounds: Normal heart sounds.  Pulmonary:     Effort: Pulmonary effort is normal. No respiratory distress.     Breath sounds: Normal breath sounds.  Abdominal:     General: There is no distension.     Palpations: Abdomen is soft.     Tenderness: There is no abdominal tenderness.  Musculoskeletal:        General: No deformity. Normal range of motion.     Cervical back: Normal range of motion and neck supple.  Skin:    General: Skin is warm and dry.  Neurological:     General: No focal deficit present.     Mental Status: She is alert and oriented to person, place, and time.    ED Results / Procedures / Treatments   Labs (all labs ordered are listed, but only abnormal results are displayed) Labs Reviewed - No data to display  EKG None  Radiology CT ABDOMEN PELVIS W CONTRAST  Result Date: 08/09/2021 CLINICAL DATA:  Left lower quadrant abdominal pain, abdominal distension EXAM: CT ABDOMEN AND PELVIS WITH CONTRAST TECHNIQUE: Multidetector CT imaging of the abdomen and pelvis was performed using the standard protocol following bolus administration of intravenous contrast. CONTRAST:  80mL OMNIPAQUE IOHEXOL 350 MG/ML SOLN COMPARISON:  None. FINDINGS: Lower chest: No acute abnormality. Hepatobiliary: Cholelithiasis without pericholecystic inflammatory change. Liver unremarkable. No intra or extrahepatic biliary ductal dilation. Pancreas: Unremarkable Spleen: Unremarkable Adrenals/Urinary Tract: The adrenal glands are unremarkable. The kidneys are normal in size and position. Simple cortical cyst  noted within the lower pole the right kidney. The kidneys are otherwise unremarkable. Bladder unremarkable. Stomach/Bowel: Mild sigmoid diverticulosis. The stomach, small bowel, and large bowel are otherwise unremarkable. Appendix normal. No free intraperitoneal gas or fluid. Vascular/Lymphatic: Mild atherosclerotic calcification within the abdominal aorta. No aortic aneurysm. There is a central filling defect seen within the left common femoral vein suspicious for acute DVT. Less likely, this may represent mixing artifact from inflow of unopacified blood. No pathologic adenopathy within the abdomen and pelvis. Reproductive: Uterus and bilateral adnexa are unremarkable. Other: No abdominal wall hernia.  Rectum unremarkable. Musculoskeletal: No acute bone abnormality. Remote superior endplate fracture of L3. No lytic or  blastic bone lesion. IMPRESSION: Mixing artifact versus acute DVT within the left common femoral vein. Correlation for history of DVT is recommended. Lower extremity Doppler sonography may be helpful for further evaluation. No acute intra-abdominal pathology identified. No definite radiographic explanation for the patient's reported symptoms. Cholelithiasis. Mild sigmoid diverticulosis. Aortic Atherosclerosis (ICD10-I70.0). Electronically Signed   By: Helyn Numbers M.D.   On: 08/09/2021 03:17   VAS [place] LOWER EXTREMITY VENOUS (DVT)  Result Date: 08/09/2021  Lower Venous DVT Study Patient Name:  SAMANTHAN DUGO  Date of Exam:   08/09/2021 Medical Rec #: [id number]       Accession #:    [id number] Date of Birth: 10-Nov-1956       Patient Gender: F Patient Age:   84 years Exam Location:  [facility] Procedure:      VAS [place] LOWER EXTREMITY VENOUS (DVT) Referring Phys: MIA MCDONALD --------------------------------------------------------------------------------  Indications: Abnormal CT of Pelvis.  Limitations: Patient unable to cooperate due to refusal to use interpreter. Comparison Study: Previous  exam 12/07/16 positive for SVT in GSV Performing Technologist: Ernestene Mention RVT, RDMS  Examination Guidelines: A complete evaluation includes B-mode imaging, spectral Doppler, color Doppler, and power Doppler as needed of all accessible portions of each vessel. Bilateral testing is considered an integral part of a complete examination. Limited examinations for reoccurring indications may be performed as noted. The reflux portion of the exam is performed with the patient in reverse Trendelenburg.  +-----+---------------+---------+-----------+----------+--------------+ RIGHTCompressibilityPhasicitySpontaneityPropertiesThrombus Aging +-----+---------------+---------+-----------+----------+--------------+ CFV  Full           Yes      Yes                                 +-----+---------------+---------+-----------+----------+--------------+   +---------+---------------+---------+-----------+----------+------------------+ LEFT     CompressibilityPhasicitySpontaneityPropertiesThrombus Aging     +---------+---------------+---------+-----------+----------+------------------+ CFV      Partial        Yes      Yes                  Chronic            +---------+---------------+---------+-----------+----------+------------------+ SFJ      Partial                                      Chronic            +---------+---------------+---------+-----------+----------+------------------+ FV Prox  Partial        No       No                   Chronic            +---------+---------------+---------+-----------+----------+------------------+ FV Mid   None           No       No                   Chronic            +---------+---------------+---------+-----------+----------+------------------+ FV DistalNone           No       No                   Chronic            +---------+---------------+---------+-----------+----------+------------------+ PFV      Full                                                             +---------+---------------+---------+-----------+----------+------------------+  POP      None           No       No                   Age Indeterminate  +---------+---------------+---------+-----------+----------+------------------+ PTV      None           No       No                   Acute              +---------+---------------+---------+-----------+----------+------------------+ PERO     None           No       No                   Acute              +---------+---------------+---------+-----------+----------+------------------+ Gastroc  None           No       No                   Age Indeterminate  +---------+---------------+---------+-----------+----------+------------------+ SSV      Partial        No       No                   Acute              +---------+---------------+---------+-----------+----------+------------------+ EIV      Partial        Yes      Yes                  Chronic - Distal                                                         EIV                +---------+---------------+---------+-----------+----------+------------------+   Summary: RIGHT: - No evidence of common femoral vein obstruction.  LEFT: - Findings consistent with acute deep vein thrombosis involving the left posterior tibial veins, and left peroneal veins. - Findings consistent with acute superficial vein thrombosis involving the left small saphenous vein. - Findings consistent with age indeterminate deep vein thrombosis involving the left popliteal vein, and left gastrocnemius veins. 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 - No cystic structure found in the popliteal fossa.  *See table(s) above for measurements and observations.    Preliminary     Procedures Procedures   Medications Ordered in ED Medications - No data to display  ED Course  I  have reviewed the triage vital signs and the nursing notes.  Pertinent labs & imaging results that were available during my care of the patient were reviewed by me and considered in my medical decision making (see chart for details).    MDM Rules/Calculators/A&P                           MDM  MSE complete  Shealynn Saulnier was evaluated in Emergency Department on 08/09/2021 for the symptoms described in the history of present illness. She was  evaluated in the context of the global COVID-19 pandemic, which necessitated consideration that the patient might be at risk for infection with the SARS-CoV-2 virus that causes COVID-19. Institutional protocols and algorithms that pertain to the evaluation of patients at risk for COVID-19 are in a state of rapid change based on information released by regulatory bodies including the CDC and federal and state organizations. These policies and algorithms were followed during the patient's care in the ED.  Returns to the ED with complaint of shortness of breath.  Patient with recently diagnosed DVT.  Patient is refusing IV placement and CT evaluation for possible PE.  Patient is hemodynamically stable.  She has capacity to refuse care.  Risks of missed PE explained in depth to the patient.  She understands that undiagnosed PE could result in death.  Patient's still  refuses appropriate work-up.  Patient was previously prescribed Eliquis for treatment of DVT.  She is strongly advised to continue and to take the Eliquis as previously prescribed.  Patient desires AMA.  She repeatedly refused PE work-up.  She understands that if she changes her mind she can return to the ED for evaluation.  Patient's daughter was involved in the discussion regarding appropriate work-up and treatment here in the ED.  The patient's daughter was unable to change the patient's mind about getting the appropriate work-up.  Importance of close follow-up is repeatedly stressed.  Strict  return precautions given and understood.   Final Clinical Impression(s) / ED Diagnoses Final diagnoses:  Shortness of breath    Rx / DC Orders ED Discharge Orders     None        Wynetta FinesMessick, Finnick Orosz C, MD 08/09/21 [phone]

## 2021-08-10 LAB — URINE CULTURE

## 2021-08-20 NOTE — Progress Notes (Signed)
[facility] [place]  ASSESSMENT / PLAN: Loretta Copeland is a 65 y.o. 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  No indication for intervention on deep venous thrombosis.  Recommend a 76-month course of therapeutic anticoagulation.  She has not tolerated Eliquis very well so far.  We will transition her to Xarelto.  Follow-up with me as needed.  CHIEF COMPLAINT: ER follow-up for DVT  HISTORY OF PRESENT ILLNESS: Loretta Copeland is a 65 y.o. female who speaks limited [place].  She presents to clinic today with her daughter who acts as a Nurse, learning disability.  The patient has multiple complaints, none of which are related to her left lower extremity.  She is a tangential historian.  Her daughter helps a lot with the history.  The patient presented to [facility] 08/09/21 for evaluation of abdominal pain, distention, fever.  A CT scan was performed which was read as concerning for possible left common femoral vein deep venous thrombosis.  This was followed by duplex ultrasound which confirmed acute on chronic left lower extremity DVT.  Apparently, the patient became frustrated by the long wait time and left before CT scan of the chest could be done to rule out pulmonary embolus.  She returned later that day but again refused an IV and a CT scan.  On my evaluation today she is in no distress.  She has multiple complaints regarding her sinuses as well as pain in her joints.  She has no pain in the left leg.  She is ambulating is much as she likes.  She has no pain when she ambulates.  Past Medical History:  Diagnosis Date   Arthritis    Depression    DVT (deep venous thrombosis) (HCC)    Migraines    Thrombophlebitis 11/2016    No past surgical history on file.  Family History  Problem Relation Age of Onset   Diabetes Mother    Diabetes Father    Diabetes Brother    Diabetes Daughter     Social History   Socioeconomic History    Marital status: Married    Spouse name: Not on file   Number of children: Not on file   Years of education: Not on file   Highest education level: Not on file  Occupational History   Not on file  Tobacco Use   Smoking status: Some Days    Packs/day: 1.00    Years: 30.00    Pack years: 30.00    Types: Cigarettes   Smokeless tobacco: Never   Tobacco comments:    not smoking every day  Vaping Use   Vaping Use: Never used  Substance and Sexual Activity   Alcohol use: No    Alcohol/week: 0.0 standard drinks   Drug use: No   Sexual activity: Never  Other Topics Concern   Not on file  Social History Narrative   Not on file   Social Determinants of Health   Financial Resource Strain: Not on file  Food Insecurity: Not on file  Transportation Needs: Not on file  Physical Activity: Not on file  Stress: Not on file  Social Connections: Not on file  Intimate Partner Violence: Not on file    Allergies  Allergen Reactions   Penicillins    Tetracyclines & Related     Current Outpatient Medications  Medication Sig Dispense Refill   albuterol (PROVENTIL HFA;VENTOLIN HFA) 108 (90 Base) MCG/ACT inhaler Inhale 2 puffs into the lungs  every 4 (four) hours as needed for wheezing or shortness of breath. 1 Inhaler 0   apixaban (ELIQUIS) 5 MG TABS tablet Take 2 tablets (10mg ) twice daily for 7 days, then 1 tablet (5mg ) twice daily 60 tablet 1   cetirizine (ZYRTEC ALLERGY) 10 MG tablet Take 1 tablet (10 mg total) by mouth daily. 90 tablet 0   cyclobenzaprine (FLEXERIL) 5 MG tablet Take 1 tablet (5 mg total) by mouth at bedtime. 15 tablet 0   diclofenac sodium (VOLTAREN) 1 % GEL Apply 2 g topically 4 (four) times daily. 100 g 2   erythromycin (E-MYCIN) 250 MG tablet Take 1 tablet (250 mg total) by mouth 4 (four) times daily. 28 tablet 0   predniSONE (STERAPRED UNI-PAK 21 TAB) 10 MG (21) TBPK tablet Take by mouth daily. Take 6 tabs by mouth daily  for 2 days, then 5 tabs for 2 days, then 4  tabs for 2 days, then 3 tabs for 2 days, 2 tabs for 2 days, then 1 tab by mouth daily for 2 days 42 tablet 0   No current facility-administered medications for this visit.    REVIEW OF SYSTEMS:  [X]  denotes positive finding, [ ]  denotes negative finding Cardiac  Comments:  Chest pain or chest pressure:    Shortness of breath upon exertion:    Short of breath when lying flat:    Irregular heart rhythm:        Vascular    Pain in calf, thigh, or hip brought on by ambulation:    Pain in feet at night that wakes you up from your sleep:     Blood clot in your veins:    Leg swelling:         Pulmonary    Oxygen at home:    Productive cough:     Wheezing:         Neurologic    Sudden weakness in arms or legs:     Sudden numbness in arms or legs:     Sudden onset of difficulty speaking or slurred speech:    Temporary loss of vision in one eye:     Problems with dizziness:         Gastrointestinal    Blood in stool:     Vomited blood:         Genitourinary    Burning when urinating:     Blood in urine:        Psychiatric    Major depression:         Hematologic    Bleeding problems:    Problems with blood clotting too easily:        Skin    Rashes or ulcers:        Constitutional    Fever or chills:      PHYSICAL EXAM Vitals:   08/21/21 0850  BP: (!) 149/87  Pulse: 92  Resp: 20  Temp: 97.7 F (36.5 C)  SpO2: 99%  Weight: 148 lb (67.1 kg)  Height: 5\' 2"  (1.575 m)    Constitutional: well appearing. no distress. Appears well nourished.  Neurologic: CN intact. no focal findings. no sensory loss. Psychiatric:  Mood and affect symmetric and appropriate. Eyes:  No icterus. No conjunctival pallor. Ears, nose, throat:  mucous membranes moist. Midline trachea.  Cardiac: regular rate and rhythm.  Respiratory:  unlabored. Abdominal:  soft, non-tender, non-distended.  Peripheral vascular: 2+ DP pulses bilaterally. Reticular veins about the feet  bilaterally. Extremity: no edema.  no cyanosis. no pallor.  Skin: no gangrene. no ulceration.  Lymphatic: no Stemmer's sign. no palpable lymphadenopathy.  PERTINENT LABORATORY AND RADIOLOGIC DATA  Most recent CBC CBC Latest Ref Rng & Units 08/08/2021 12/05/2015 08/02/2015  WBC 4.0 - 10.5 K/uL 6.8 5.6 7.4  Hemoglobin 12.0 - 15.0 g/dL 16.1 09.6 04.5  Hematocrit 36.0 - 46.0 % 46.3(H) 42.2 41.9  Platelets 150 - 400 K/uL 238 - -     Most recent CMP CMP Latest Ref Rng & Units 08/08/2021  Glucose 70 - 99 mg/dL 90  BUN 8 - 23 mg/dL 7(L)  Creatinine 4.09 - 1.00 mg/dL 8.11  Sodium 914 - 782 mmol/L 141  Potassium 3.5 - 5.1 mmol/L 4.3  Chloride 98 - 111 mmol/L 106  CO2 22 - 32 mmol/L 27  Calcium 8.9 - 10.3 mg/dL 9.4  Total Protein 6.5 - 8.1 g/dL 6.9  Total Bilirubin 0.3 - 1.2 mg/dL 0.4  Alkaline Phos 38 - 126 U/L 79  AST 15 - 41 U/L 18  ALT 0 - 44 U/L 14    Renal function Estimated Creatinine Clearance: 61.3 mL/min (by C-G formula based on SCr of 0.73 mg/dL).  No results found for: HGBA1C  No results found for: LDLCALC, LDLC, HIRISKLDL, POCLDL, LDLDIRECT, REALLDLC, TOTLDLC   CLINICAL DATA:  Left lower quadrant abdominal pain, abdominal distension   EXAM: CT ABDOMEN AND PELVIS WITH CONTRAST   TECHNIQUE: Multidetector CT imaging of the abdomen and pelvis was performed using the standard protocol following bolus administration of intravenous contrast.   CONTRAST:  80mL OMNIPAQUE IOHEXOL 350 MG/ML SOLN   COMPARISON:  None.   FINDINGS: Lower chest: No acute abnormality.   Hepatobiliary: Cholelithiasis without pericholecystic inflammatory change. Liver unremarkable. No intra or extrahepatic biliary ductal dilation.   Pancreas: Unremarkable   Spleen: Unremarkable   Adrenals/Urinary Tract: The adrenal glands are unremarkable. The kidneys are normal in size and position. Simple cortical cyst noted within the lower pole the right kidney. The kidneys are  otherwise unremarkable. Bladder unremarkable.   Stomach/Bowel: Mild sigmoid diverticulosis. The stomach, small bowel, and large bowel are otherwise unremarkable. Appendix normal. No free intraperitoneal gas or fluid.   Vascular/Lymphatic: Mild atherosclerotic calcification within the abdominal aorta. No aortic aneurysm. There is a central filling defect seen within the left common femoral vein suspicious for acute DVT. Less likely, this may represent mixing artifact from inflow of unopacified blood. No pathologic adenopathy within the abdomen and pelvis.   Reproductive: Uterus and bilateral adnexa are unremarkable.   Other: No abdominal wall hernia.  Rectum unremarkable.   Musculoskeletal: No acute bone abnormality. Remote superior endplate fracture of L3. No lytic or blastic bone lesion.   IMPRESSION: Mixing artifact versus acute DVT within the left common femoral vein. Correlation for history of DVT is recommended. Lower extremity Doppler sonography may be helpful for further evaluation.   No acute intra-abdominal pathology identified. No definite radiographic explanation for the patient's reported symptoms.   Cholelithiasis.   Mild sigmoid diverticulosis.   Aortic Atherosclerosis (ICD10-I70.0).     Electronically Signed   By: Helyn Numbers M.D.   On: 08/09/2021 03:17   Lower Venous DVT Study   Patient Name:  DEMETRICE AMSTUTZ  Date of Exam:   08/09/2021  Medical Rec #: [id number]       Accession #:    [id number]  Date of Birth: 1956-02-21       Patient Gender: F  Patient Age:   20 years  Exam Location:  [facility]  Procedure:      VAS US LOWER EXTREMITY VENOUS (DVT)  Referring Phys: MIA MCDONALD    ---------------------------------------------------------------------------  -----     Indications: Abnormal CT of Pelvis.     Limitations: Patient unable to cooperate due to refusal to use  interpreter.  Comparison Study: Previous exam 12/07/16 positive  for SVT in GSV   Performing Technologist: Ernestene MentionJody Hill RVT, RDMS      Examination Guidelines:  A complete evaluation includes B-mode imaging, spectral Doppler, color  Doppler,  and power Doppler as needed of all accessible portions of each vessel.  Bilateral  testing is considered an integral part of a complete examination. Limited  examinations for reoccurring indications may be performed as noted. The  reflux  portion of the exam is performed with the patient in reverse  Trendelenburg.       +-----+---------------+---------+-----------+----------+--------------+  RIGHTCompressibilityPhasicitySpontaneityPropertiesThrombus Aging  +-----+---------------+---------+-----------+----------+--------------+  CFV  Full           Yes      Yes                                  +-----+---------------+---------+-----------+----------+--------------+           +---------+---------------+---------+-----------+----------+---------------  ---+  LEFT     CompressibilityPhasicitySpontaneityPropertiesThrombus Aging       +---------+---------------+---------+-----------+----------+---------------  ---+  CFV      Partial        Yes      Yes                  Chronic               +---------+---------------+---------+-----------+----------+---------------  ---+  SFJ      Partial                                      Chronic               +---------+---------------+---------+-----------+----------+---------------  ---+  FV Prox  Partial        No       No                   Chronic               +---------+---------------+---------+-----------+----------+---------------  ---+  FV Mid   None           No       No                   Chronic               +---------+---------------+---------+-----------+----------+---------------  ---+  FV DistalNone           No       No                   Chronic                +---------+---------------+---------+-----------+----------+---------------  ---+  PFV      Full                                                              +---------+---------------+---------+-----------+----------+---------------  ---+  POP      None           No       No                   Age  Indeterminate   +---------+---------------+---------+-----------+----------+---------------  ---+  PTV      None           No       No                   Acute                 +---------+---------------+---------+-----------+----------+---------------  ---+  PERO     None           No       No                   Acute                 +---------+---------------+---------+-----------+----------+---------------  ---+  Gastroc  None           No       No                   Age  Indeterminate   +---------+---------------+---------+-----------+----------+---------------  ---+  SSV      Partial        No       No                   Acute                 +---------+---------------+---------+-----------+----------+---------------  ---+  EIV      Partial        Yes      Yes                  Chronic -  Distal                                                          EIV                   +---------+---------------+---------+-----------+----------+---------------  ---+                  Summary:  RIGHT:  - No evidence of common femoral vein obstruction.     LEFT:  - Findings consistent with acute deep vein thrombosis involving the left  posterior tibial veins, and left peroneal veins.  - Findings consistent with acute superficial vein thrombosis involving the  left small saphenous vein.  - Findings consistent with age indeterminate deep vein thrombosis  involving the left popliteal vein, and left gastrocnemius veins.  - Findings consistent with chronic deep vein thrombosis involving the left  common femoral vein, SF junction, and  left femoral vein.  - No cystic structure found in the popliteal fossa.      *See table(s) above for measurements and observations.   Electronically signed by Heath Lark on 08/09/2021 at 5:47:24 PM.       Rande Brunt. Lenell Antu, MD [facility] Phone Number: [phone] 08/20/2021 7:31 PM  Total time spent on preparing this encounter including chart review, data review, collecting history, examining the patient, coordinating care  for this new patient, 60 minutes.  Portions of this report may have been transcribed using voice recognition software.  Every effort has been made to ensure accuracy; however, inadvertent computerized transcription errors may still be present.

## 2021-08-21 ENCOUNTER — Ambulatory Visit (HOSPITAL_COMMUNITY)
Admission: EM | Admit: 2021-08-21 | Discharge: 2021-08-21 | Disposition: A | Discharge status: Home / Self Care | Payer: 59 | Attending: Emergency Medicine | Admitting: Emergency Medicine

## 2021-08-21 ENCOUNTER — Other Ambulatory Visit: Payer: Self-pay

## 2021-08-21 ENCOUNTER — Encounter: Payer: Self-pay | Admitting: Vascular Surgery

## 2021-08-21 ENCOUNTER — Ambulatory Visit (HOSPITAL_COMMUNITY): Admission: EM | Admit: 2021-08-21 | Discharge: 2021-08-21 | Discharge status: Absent without leave | Payer: Self-pay

## 2021-08-21 ENCOUNTER — Ambulatory Visit (INDEPENDENT_AMBULATORY_CARE_PROVIDER_SITE_OTHER): Payer: 59 | Admitting: Vascular Surgery

## 2021-08-21 VITALS — BP 149/87 | HR 92 | Temp 97.7°F | Resp 20 | Ht 62.0 in | Wt 148.0 lb

## 2021-08-21 DIAGNOSIS — M19011 Primary osteoarthritis, right shoulder: Secondary | ICD-10-CM | POA: Diagnosis not present

## 2021-08-21 DIAGNOSIS — I82422 Acute embolism and thrombosis of left iliac vein: Secondary | ICD-10-CM

## 2021-08-21 DIAGNOSIS — J320 Chronic maxillary sinusitis: Secondary | ICD-10-CM

## 2021-08-21 DIAGNOSIS — M19019 Primary osteoarthritis, unspecified shoulder: Secondary | ICD-10-CM

## 2021-08-21 DIAGNOSIS — M19012 Primary osteoarthritis, left shoulder: Secondary | ICD-10-CM

## 2021-08-21 MED ORDER — RIVAROXABAN 20 MG PO TABS: 20.0000 mg | ORAL_TABLET | Freq: Every day | ORAL | 0 refills | Status: DC

## 2021-08-21 MED ORDER — PREDNISONE 10 MG (21) PO TBPK: ORAL_TABLET | Freq: Every day | ORAL | 0 refills | Status: DC

## 2021-08-21 MED ORDER — FLUTICASONE PROPIONATE 50 MCG/ACT NA SUSP: 1.0000 | Freq: Every day | NASAL | 2 refills | Status: AC

## 2021-08-21 MED ORDER — AZITHROMYCIN 250 MG PO TABS: 250.0000 mg | ORAL_TABLET | Freq: Every day | ORAL | 0 refills | Status: DC

## 2021-08-21 MED ORDER — PREDNISONE 20 MG PO TABS
40.0000 mg | ORAL_TABLET | Freq: Every day | ORAL | 0 refills | Status: DC
Start: 2021-08-21 — End: 2022-10-29

## 2021-08-21 NOTE — ED Provider Notes (Addendum)
[facility]    CSN: [id number] Arrival date & time: 08/21/21  1558      History   Chief Complaint Chief Complaint  Patient presents with   Headache   Facial Pain   Arm Pain    HPI Loretta Copeland is a 65 y.o. female.   Patient presents with facial pain, nasal congestion, intermittent generalized headache, non productive cough for 2 days. Denies fever,chills, body aches, abdominal pain, nausea, vomiting, diarrhea, sore throat, shortness of breath, chest pain or tightness, wheezing. History of frequent sinus infections. Has upcoming appointment in 1 week with ENT.   Concerned with diffuse arthritic pain in bilateral shoulders, elbows and hands for years. Described as a flare up and causing much discomfort. Interfering with sleep, having to sleep on sides. ROM intact. Denies numbness and tingling.   [place] interpreter used for exam  Past Medical History:  Diagnosis Date   Arthritis    Depression    DVT (deep venous thrombosis) (HCC)    Migraines    Thrombophlebitis 11/2016    Patient Active Problem List   Diagnosis Date Noted   Thrombophlebitis 11/15/2016   Sinusitis, chronic 09/07/2016   Smoking 11/06/2015    No past surgical history on file.  OB History   No obstetric history on file.      Home Medications    Prior to Admission medications   Medication Sig Start Date End Date Taking? Authorizing Provider  azithromycin (ZITHROMAX) 250 MG tablet Take 1 tablet (250 mg total) by mouth daily. Take first 2 tablets together, then 1 every day until finished. 08/21/21  Yes Madisson Kulaga R, NP  fluticasone (FLONASE) 50 MCG/ACT nasal spray Place 1 spray into both nostrils daily. 08/21/21  Yes Wheeler Incorvaia R, NP  predniSONE (DELTASONE) 20 MG tablet Take 2 tablets (40 mg total) by mouth daily. 08/21/21  Yes Myrical Andujo R, NP  albuterol (PROVENTIL HFA;VENTOLIN HFA) 108 (90 Base) MCG/ACT inhaler Inhale 2 puffs into the lungs every 4 (four) hours as needed for  wheezing or shortness of breath. 12/10/18   Mardella Layman, MD  cetirizine (ZYRTEC ALLERGY) 10 MG tablet Take 1 tablet (10 mg total) by mouth daily. 08/21/20   Wallis Bamberg, PA-C  cyclobenzaprine (FLEXERIL) 5 MG tablet Take 1 tablet (5 mg total) by mouth at bedtime. 04/13/21   Linus Mako B, NP  diclofenac sodium (VOLTAREN) 1 % GEL Apply 2 g topically 4 (four) times daily. 05/04/16   [name], MD  erythromycin (E-MYCIN) 250 MG tablet Take 1 tablet (250 mg total) by mouth 4 (four) times daily. 02/01/21   Zadie Rhine, MD  LORazepam (ATIVAN) 2 MG tablet Take 2 mg by mouth 3 (three) times daily as needed. 08/14/21   [provider]  rivaroxaban (XARELTO) 20 MG TABS tablet Take 1 tablet (20 mg total) by mouth daily with supper. 08/21/21 11/20/21  Leonie Douglas, MD    Family History Family History  Problem Relation Age of Onset   Diabetes Mother    Diabetes Father    Diabetes Brother    Diabetes Daughter     Social History Social History   Tobacco Use   Smoking status: Every Day    Packs/day: 1.00    Years: 30.00    Pack years: 30.00    Types: Cigarettes   Smokeless tobacco: Never   Tobacco comments:    not smoking every day  Vaping Use   Vaping Use: Never used  Substance Use Topics   Alcohol  use: No    Alcohol/week: 0.0 standard drinks   Drug use: No     Allergies   Penicillins and Tetracyclines & related   Review of Systems Review of Systems Defer to HPI   Physical Exam Triage Vital Signs ED Triage Vitals  Enc Vitals Group     BP 08/21/21 1702 (!) 150/85     Pulse Rate 08/21/21 1702 84     Resp 08/21/21 1702 20     Temp 08/21/21 1702 98.2 F (36.8 C)     Temp Source 08/21/21 1702 Oral     SpO2 08/21/21 1702 97 %     Weight --      Height --      Head Circumference --      Peak Flow --      Pain Score 08/21/21 1819 8     Pain Loc --      Pain Edu? --      Excl. in GC? --    No data found.  Updated Vital Signs BP (!) 150/85 (BP  Location: Right Arm)   Pulse 84   Temp 98.2 F (36.8 C) (Oral)   Resp 20   SpO2 97%   Visual Acuity Right Eye Distance:   Left Eye Distance:   Bilateral Distance:    Right Eye Near:   Left Eye Near:    Bilateral Near:     Physical Exam Constitutional:      Appearance: Normal appearance. She is normal weight.  HENT:     Head: Normocephalic.     Right Ear: Tympanic membrane, ear canal and external ear normal.     Left Ear: Tympanic membrane, ear canal and external ear normal.     Nose: Congestion present. No rhinorrhea.     Right Sinus: Maxillary sinus tenderness present. No frontal sinus tenderness.     Left Sinus: Maxillary sinus tenderness present. No frontal sinus tenderness.     Mouth/Throat:     Mouth: Mucous membranes are moist.     Pharynx: Oropharynx is clear.  Eyes:     Extraocular Movements: Extraocular movements intact.  Cardiovascular:     Rate and Rhythm: Normal rate and regular rhythm.     Pulses: Normal pulses.     Heart sounds: Normal heart sounds.  Pulmonary:     Effort: Pulmonary effort is normal.     Breath sounds: Normal breath sounds.  Musculoskeletal:     Cervical back: Normal range of motion and neck supple.     Comments: Diffuse tenderness throughout bilateral shoulders, elbows and hands. No swelling, point tenderness noted.  ROM intact   Skin:    General: Skin is warm and dry.  Neurological:     Mental Status: She is alert and oriented to person, place, and time. Mental status is at baseline.  Psychiatric:        Mood and Affect: Mood normal.        Behavior: Behavior normal.     UC Treatments / Results  Labs (all labs ordered are listed, but only abnormal results are displayed) Labs Reviewed - No data to display  EKG   Radiology No results found.  Procedures Procedures (including critical care time)  Medications Ordered in UC Medications - No data to display  Initial Impression / Assessment and Plan / UC Course  I have  reviewed the triage vital signs and the nursing notes.  Pertinent labs & imaging results that were available during my care of the  patient were reviewed by me and considered in my medical decision making (see chart for details).  Chronic maxillary sinusitis Arthritis pain of shoulder  Azithromycin 500 mg once, 250 mg for 5 days  Flonase 1 spray each nare daily Prednisone 40 mg daily for 5 days Strongly encourage patient to keep appointment with ENT next week  Final Clinical Impressions(s) / UC Diagnoses   Final diagnoses:  Chronic maxillary sinusitis  Arthritis pain of shoulder     Discharge Instructions      Take antibiotic as prescribed  Take prednisone every morning with food for 5 days  Use Flonase 1 spray daily as needed   Keep appointment with ear nose throat specialist    ED Prescriptions     Medication Sig Dispense Auth. Provider   predniSONE (STERAPRED UNI-PAK 21 TAB) 10 MG (21) TBPK tablet  (Status: Discontinued) Take by mouth daily. Take 6 tabs by mouth daily  for 2 days, then 5 tabs for 2 days, then 4 tabs for 2 days, then 3 tabs for 2 days, 2 tabs for 2 days, then 1 tab by mouth daily for 2 days 42 tablet Yaminah Clayborn R, NP   fluticasone (FLONASE) 50 MCG/ACT nasal spray Place 1 spray into both nostrils daily. 11.1 mL Sri Clegg R, NP   azithromycin (ZITHROMAX) 250 MG tablet Take 1 tablet (250 mg total) by mouth daily. Take first 2 tablets together, then 1 every day until finished. 6 tablet Ariyona Eid R, NP   predniSONE (DELTASONE) 20 MG tablet Take 2 tablets (40 mg total) by mouth daily. 10 tablet Valinda Hoar, NP      PDMP not reviewed this encounter.   Valinda Hoar, NP 08/21/21 1850    Valinda Hoar, NP 08/21/21 1850

## 2021-08-21 NOTE — ED Triage Notes (Signed)
Used [place] interpretor Pt having sinus pressure/headache for 2 days  Reports Flonase will help when prescribed. Pt having pain in left arm 10-15 days hx arthritis and can't take ibuprofen. Was supposed to see ENT but hasnt been able to get an appt to be seen.

## 2021-08-21 NOTE — Discharge Instructions (Addendum)
Take antibiotic as prescribed  Take prednisone every morning with food for 5 days  Use Flonase 1 spray daily as needed   Keep appointment with ear nose throat specialist

## 2021-11-21 ENCOUNTER — Other Ambulatory Visit: Payer: Self-pay | Admitting: Vascular Surgery

## 2021-11-21 ENCOUNTER — Other Ambulatory Visit: Payer: Self-pay

## 2021-11-21 ENCOUNTER — Emergency Department (HOSPITAL_COMMUNITY)
Admission: EM | Admit: 2021-11-21 | Discharge: 2021-11-22 | Disposition: A | Discharge status: Home / Self Care | Payer: 59 | Attending: Emergency Medicine | Admitting: Emergency Medicine

## 2021-11-21 ENCOUNTER — Encounter (HOSPITAL_COMMUNITY): Payer: Self-pay | Admitting: Emergency Medicine

## 2021-11-21 ENCOUNTER — Emergency Department (HOSPITAL_BASED_OUTPATIENT_CLINIC_OR_DEPARTMENT_OTHER)
Admit: 2021-11-21 | Discharge: 2021-11-21 | Disposition: A | Discharge status: Home / Self Care | Payer: 59 | Attending: Emergency Medicine | Admitting: Emergency Medicine

## 2021-11-21 DIAGNOSIS — M79604 Pain in right leg: Secondary | ICD-10-CM

## 2021-11-21 DIAGNOSIS — M79605 Pain in left leg: Secondary | ICD-10-CM | POA: Diagnosis not present

## 2021-11-21 DIAGNOSIS — R718 Other abnormality of red blood cells: Secondary | ICD-10-CM | POA: Insufficient documentation

## 2021-11-21 DIAGNOSIS — Z7901 Long term (current) use of anticoagulants: Secondary | ICD-10-CM | POA: Diagnosis not present

## 2021-11-21 DIAGNOSIS — R109 Unspecified abdominal pain: Secondary | ICD-10-CM

## 2021-11-21 DIAGNOSIS — R748 Abnormal levels of other serum enzymes: Secondary | ICD-10-CM | POA: Insufficient documentation

## 2021-11-21 DIAGNOSIS — I825Z2 Chronic embolism and thrombosis of unspecified deep veins of left distal lower extremity: Secondary | ICD-10-CM | POA: Insufficient documentation

## 2021-11-21 DIAGNOSIS — M79662 Pain in left lower leg: Secondary | ICD-10-CM | POA: Diagnosis present

## 2021-11-21 DIAGNOSIS — R1032 Left lower quadrant pain: Secondary | ICD-10-CM | POA: Insufficient documentation

## 2021-11-21 DIAGNOSIS — M79661 Pain in right lower leg: Secondary | ICD-10-CM | POA: Insufficient documentation

## 2021-11-21 DIAGNOSIS — Z760 Encounter for issue of repeat prescription: Secondary | ICD-10-CM | POA: Diagnosis not present

## 2021-11-21 DIAGNOSIS — F17210 Nicotine dependence, cigarettes, uncomplicated: Secondary | ICD-10-CM | POA: Insufficient documentation

## 2021-11-21 LAB — COMPREHENSIVE METABOLIC PANEL
ALT: 10 U/L (ref 0–44)
AST: 19 U/L (ref 15–41)
Albumin: 3.8 g/dL (ref 3.5–5.0)
Alkaline Phosphatase: 62 U/L (ref 38–126)
Anion gap: 10 (ref 5–15)
BUN: 9 mg/dL (ref 8–23)
CO2: 23 mmol/L (ref 22–32)
Calcium: 9.8 mg/dL (ref 8.9–10.3)
Chloride: 104 mmol/L (ref 98–111)
Creatinine, Ser: 0.88 mg/dL (ref 0.44–1.00)
GFR, Estimated: >60 mL/min (ref >60)
Glucose, Bld: 103 mg/dL — ABNORMAL HIGH (ref 70–99)
Potassium: 4.2 mmol/L (ref 3.5–5.1)
Sodium: 137 mmol/L (ref 135–145)
Total Bilirubin: 0.5 mg/dL (ref 0.3–1.2)
Total Protein: 7.1 g/dL (ref 6.5–8.1)

## 2021-11-21 LAB — URINALYSIS, MICROSCOPIC (REFLEX)

## 2021-11-21 LAB — URINALYSIS, ROUTINE W REFLEX MICROSCOPIC
Bilirubin Urine: NEGATIVE
Glucose, UA: NEGATIVE mg/dL
Hgb urine dipstick: NEGATIVE
Ketones, ur: NEGATIVE mg/dL
Nitrite: NEGATIVE
Protein, ur: NEGATIVE mg/dL
Specific Gravity, Urine: 1.02 (ref 1.005–1.030)
pH: 6 (ref 5.0–8.0)

## 2021-11-21 LAB — CBC WITH DIFFERENTIAL/PLATELET
Abs Immature Granulocytes: 0.04 10*3/uL (ref 0.00–0.07)
Basophils Absolute: 0.1 10*3/uL (ref 0.0–0.1)
Basophils Relative: 1 %
Eosinophils Absolute: 0.1 10*3/uL (ref 0.0–0.5)
Eosinophils Relative: 1 %
HCT: 47.9 % — ABNORMAL HIGH (ref 36.0–46.0)
Hemoglobin: 15.9 g/dL — ABNORMAL HIGH (ref 12.0–15.0)
Immature Granulocytes: 0 %
Lymphocytes Relative: 28 %
Lymphs Abs: 2.6 10*3/uL (ref 0.7–4.0)
MCH: 28.6 pg (ref 26.0–34.0)
MCHC: 33.2 g/dL (ref 30.0–36.0)
MCV: 86.3 fL (ref 80.0–100.0)
Monocytes Absolute: 0.7 10*3/uL (ref 0.1–1.0)
Monocytes Relative: 8 %
Neutro Abs: 5.8 10*3/uL (ref 1.7–7.7)
Neutrophils Relative %: 62 %
Platelets: 293 10*3/uL (ref 150–400)
RBC: 5.55 MIL/uL — ABNORMAL HIGH (ref 3.87–5.11)
RDW: 14.7 % (ref 11.5–15.5)
WBC: 9.3 10*3/uL (ref 4.0–10.5)
nRBC: 0 % (ref 0.0–0.2)

## 2021-11-21 LAB — LIPASE, BLOOD: Lipase: 63 U/L — ABNORMAL HIGH (ref 11–51)

## 2021-11-21 NOTE — ED Triage Notes (Signed)
Pt presents with multiple complaints. Reports hx DVT, currently out of xarelto, states improvement to leg pain. C/o occasional swelling to LLQ, and decreased appetite and vomiting x 1 month.

## 2021-11-21 NOTE — Progress Notes (Signed)
Bilateral lower extremity venous duplex has been completed. Preliminary results can be found in CV Proc through chart review.  Results were given to Tanda Rockers PA.  11/21/21 7:11 PM Olen Cordial RVT

## 2021-11-21 NOTE — ED Provider Notes (Signed)
Emergency Medicine Provider Triage Evaluation Note  Loretta Copeland , a 65 y.o. female  was evaluated in triage.  Pt complains of double complaints.  She reports that she was recently diagnosed with a DVT and has been taking Xarelto however ran out of medications today.  Her husband tried to call the pharmacy to get a refill however they were told that they would need to see the doctor prior to being represcribed this medication.  He attempted to call the vascular specialist however did not hear back and is unsure if she needs to be on this medication.  Patient states she sometimes has pain in her legs however it is better than what it was.  She also complains of left lower quadrant abdominal pain for the past 3 to 4 months.  She states she feels like her abdomen gets swollen at times.  At times she will have nausea and vomiting.  She has not eaten much in the past 3 to 4 days.  She last had a bowel movement yesterday.  Nuys any previous abdominal surgeries.  Review of Systems  Positive: + leg pain, abd pain, nausea, vomiting Negative: - diarrhea, constipation, urinary symptoms, chest pain, SOB  Physical Exam  BP (!) 144/88 (BP Location: Right Arm)   Pulse (!) 102   Temp 97.8 F (36.6 C) (Oral)   Resp 16   SpO2 98%  Gen:   Awake, no distress   Resp:  Normal effort  MSK:   Moves extremities without difficulty  Other:  Mild epigastric abd TTP  Medical Decision Making  Medically screening exam initiated at 6:43 PM.  Appropriate orders placed.  Breindy Cappelli was informed that the remainder of the evaluation will be completed by another provider, this initial triage assessment does not replace that evaluation, and the importance of remaining in the ED until their evaluation is complete.     Tanda Rockers, PA-C 11/21/21 1845    Gerhard Munch, MD 11/21/21 2137

## 2021-11-22 MED ORDER — RIVAROXABAN 10 MG PO TABS
20.0000 mg | ORAL_TABLET | Freq: Once | ORAL | Status: AC
  Administered 2021-11-22: 20 mg via ORAL
  Filled 2021-11-22: qty 2

## 2021-11-22 MED ORDER — DICYCLOMINE HCL 20 MG PO TABS: 20.0000 mg | ORAL_TABLET | Freq: Two times a day (BID) | ORAL | 0 refills | Status: DC

## 2021-11-22 MED ORDER — DICYCLOMINE HCL 20 MG PO TABS: 20.0000 mg | ORAL_TABLET | Freq: Two times a day (BID) | ORAL | 0 refills | Status: AC

## 2021-11-22 NOTE — Discharge Instructions (Signed)
Please read and follow all provided instructions.  Your diagnoses today include:  1. Chronic deep vein thrombosis (DVT) of distal vein of left lower extremity (HCC)   2. Medication refill   3. Abdominal cramping     Tests performed today include: Blood cell counts and platelets Kidney and liver function tests Pancreas function test (called lipase) -slightly high only Urine test to look for infection -no sign of infection An ultrasound of both legs: No blood clot on the right side, old blood clot on the left side Vital signs. See below for your results today.   Medications prescribed:  Bentyl - medication for intestinal cramps and spasms  Take any prescribed medications only as directed.  Home care instructions:  Follow any educational materials contained in this packet. Please fill the Xarelto prescription and take as prescribed.  Follow-up instructions: Please follow-up with your doctors as listed above.  Return instructions:  SEEK IMMEDIATE MEDICAL ATTENTION IF: The pain does not go away or becomes severe  A temperature above 101F develops  Repeated vomiting occurs (multiple episodes)  The pain becomes localized to portions of the abdomen. The right side could possibly be appendicitis. In an adult, the left lower portion of the abdomen could be colitis or diverticulitis.  Blood is being passed in stools or vomit (bright red or black tarry stools)  You develop chest pain, difficulty breathing, dizziness or fainting, or become confused, poorly responsive, or inconsolable (young children) If you have any other emergent concerns regarding your health  Additional Information: Abdominal (belly) pain can be caused by many things. Your caregiver performed an examination and possibly ordered blood/urine tests and imaging (CT scan, x-rays, ultrasound). Many cases can be observed and treated at home after initial evaluation in the emergency department. Even though you are being  discharged home, abdominal pain can be unpredictable. Therefore, you need a repeated exam if your pain does not resolve, returns, or worsens. Most patients with abdominal pain don't have to be admitted to the hospital or have surgery, but serious problems like appendicitis and gallbladder attacks can start out as nonspecific pain. Many abdominal conditions cannot be diagnosed in one visit, so follow-up evaluations are very important.  Your vital signs today were: BP 130/69 (BP Location: Left Arm)   Pulse 91   Temp 97.8 F (36.6 C)   Resp 15   SpO2 96%  If your blood pressure (bp) was elevated above 135/85 this visit, please have this repeated by your doctor within one month. --------------

## 2021-11-22 NOTE — ED Provider Notes (Signed)
[facility] EMERGENCY DEPARTMENT Provider Note   CSN: [id number] Arrival date & time: 11/21/21  1807     History Chief Complaint  Patient presents with   Leg Pain    Loretta Copeland is a 65 y.o. female.  Patient seen after extended ED wait time.  Patient was seen in the emergency department at [facility] on 08/09/2021.  At that time she was diagnosed with a left lower extremity DVT and started on Xarelto.  She states that she has followed up with cardiology in interim.  She has been on 20 mg daily.  She ran out of her medication and has not had it yesterday or today.  She states that overall her extremities have been improving although she still has some intermittent pain, including in the right lower leg.  No chest pain or shortness of breath.  In addition, patient describes approximately 1 months worth of waxing and waning decreased appetite.  She denies any definite weight loss.  No vomiting or diarrhea.  She states that the left side of her abdomen feels like it swells up and then gets better after couple days.  She describes pain as a cramping that occurs intermittently.  Only minimal soreness at the current time.  She has not seen anybody about this.  No chest pain, shortness of breath, fever.  Vital signs are normal here.  [place] [employer] interpreter utilized during interview.      Past Medical History:  Diagnosis Date   Arthritis    Depression    DVT (deep venous thrombosis) (HCC)    Migraines    Thrombophlebitis 11/2016    Patient Active Problem List   Diagnosis Date Noted   Thrombophlebitis 11/15/2016   Sinusitis, chronic 09/07/2016   Smoking 11/06/2015    History reviewed. No pertinent surgical history.   OB History   No obstetric history on file.     Family History  Problem Relation Age of Onset   Diabetes Mother    Diabetes Father    Diabetes Brother    Diabetes Daughter     Social History   Tobacco Use   Smoking status: Every Day     Packs/day: 1.00    Years: 30.00    Pack years: 30.00    Types: Cigarettes   Smokeless tobacco: Never   Tobacco comments:    not smoking every day  Vaping Use   Vaping Use: Never used  Substance Use Topics   Alcohol use: No    Alcohol/week: 0.0 standard drinks   Drug use: No    Home Medications Prior to Admission medications   Medication Sig Start Date End Date Taking? Authorizing Provider  albuterol (PROVENTIL HFA;VENTOLIN HFA) 108 (90 Base) MCG/ACT inhaler Inhale 2 puffs into the lungs every 4 (four) hours as needed for wheezing or shortness of breath. 12/10/18   Mardella Layman, MD  azithromycin (ZITHROMAX) 250 MG tablet Take 1 tablet (250 mg total) by mouth daily. Take first 2 tablets together, then 1 every day until finished. 08/21/21   Valinda Hoar, NP  cetirizine (ZYRTEC ALLERGY) 10 MG tablet Take 1 tablet (10 mg total) by mouth daily. 08/21/20   Wallis Bamberg, PA-C  cyclobenzaprine (FLEXERIL) 5 MG tablet Take 1 tablet (5 mg total) by mouth at bedtime. 04/13/21   Linus Mako B, NP  diclofenac sodium (VOLTAREN) 1 % GEL Apply 2 g topically 4 (four) times daily. 05/04/16   [name], MD  erythromycin (E-MYCIN) 250 MG tablet Take 1  tablet (250 mg total) by mouth 4 (four) times daily. 02/01/21   Ripley Fraise, MD  fluticasone (FLONASE) 50 MCG/ACT nasal spray Place 1 spray into both nostrils daily. 08/21/21   White, Leitha Schuller, NP  LORazepam (ATIVAN) 2 MG tablet Take 2 mg by mouth 3 (three) times daily as needed. 08/14/21   [provider]  predniSONE (DELTASONE) 20 MG tablet Take 2 tablets (40 mg total) by mouth daily. 08/21/21   White, Leitha Schuller, NP  XARELTO 20 MG TABS tablet TAKE 1 TABLET(20 MG) BY MOUTH DAILY WITH SUPPER 11/22/21   Cherre Robins, MD    Allergies    Penicillins and Tetracyclines & related  Review of Systems   Review of Systems  Constitutional:  Positive for appetite change. Negative for fever.  HENT:  Negative for rhinorrhea and sore throat.    Eyes:  Negative for redness.  Respiratory:  Negative for cough.   Cardiovascular:  Negative for chest pain.  Gastrointestinal:  Positive for abdominal distention, abdominal pain and nausea. Negative for diarrhea and vomiting.  Genitourinary:  Positive for frequency and urgency. Negative for dysuria and hematuria.  Musculoskeletal:  Negative for myalgias.  Skin:  Negative for rash.  Neurological:  Negative for headaches.   Physical Exam Updated Vital Signs BP 130/69 (BP Location: Left Arm)   Pulse 91   Temp 97.8 F (36.6 C)   Resp 15   SpO2 96%   Physical Exam Vitals and nursing note reviewed.  Constitutional:      General: She is not in acute distress.    Appearance: She is well-developed.  HENT:     Head: Normocephalic and atraumatic.     Right Ear: External ear normal.     Left Ear: External ear normal.     Nose: Nose normal.  Eyes:     Conjunctiva/sclera: Conjunctivae normal.  Cardiovascular:     Rate and Rhythm: Normal rate and regular rhythm.     Heart sounds: No murmur heard. Pulmonary:     Effort: No respiratory distress.     Breath sounds: No wheezing, rhonchi or rales.  Abdominal:     Palpations: Abdomen is soft.     Tenderness: There is no abdominal tenderness. There is no guarding or rebound.     Comments: Abdomen is soft and nontender on exam.  No signs of peritonitis.  Musculoskeletal:     Cervical back: Normal range of motion and neck supple.     Right lower leg: No edema.     Left lower leg: No edema.     Comments: No extensive lower extremity swelling or tenderness on exam.  No pitting edema.  Skin:    General: Skin is warm and dry.     Findings: No rash.  Neurological:     General: No focal deficit present.     Mental Status: She is alert. Mental status is at baseline.     Motor: No weakness.  Psychiatric:        Mood and Affect: Mood normal.    ED Results / Procedures / Treatments   Labs (all labs ordered are listed, but only abnormal  results are displayed) Labs Reviewed  COMPREHENSIVE METABOLIC PANEL - Abnormal; Notable for the following components:      Result Value   Glucose, Bld 103 (*)    All other components within normal limits  LIPASE, BLOOD - Abnormal; Notable for the following components:   Lipase 63 (*)    All other components  within normal limits  CBC WITH DIFFERENTIAL/PLATELET - Abnormal; Notable for the following components:   RBC 5.55 (*)    Hemoglobin 15.9 (*)    HCT 47.9 (*)    All other components within normal limits  URINALYSIS, ROUTINE W REFLEX MICROSCOPIC - Abnormal; Notable for the following components:   Leukocytes,Ua TRACE (*)    All other components within normal limits  URINALYSIS, MICROSCOPIC (REFLEX) - Abnormal; Notable for the following components:   Bacteria, UA RARE (*)    All other components within normal limits    EKG None  Radiology VAS US LOWER EXTREMITY VENOUS (DVT) (7a-7p)  Result Date: 11/21/2021  Lower Venous DVT Study Patient Name:  Rebeca AllegraBILJANA Schiro  Date of Exam:   11/21/2021 Medical Rec #: [id number]       Accession #:    [id number] Date of Birth: 1956/10/12       Patient Gender: F Patient Age:   [age] years [facility] Procedure:      VAS US LOWER EXTREMITY VENOUS (DVT) Referring Phys: MARGAUX VENTER --------------------------------------------------------------------------------  Indications: Pain.  Risk Factors: DVT. Limitations: Poor ultrasound/tissue interface. Comparison Study: 08/09/2021 - RIGHT:                   - No evidence of common femoral vein obstruction.                    LEFT:                   - Findings consistent with acute deep vein thrombosis                   involving the left                   posterior tibial veins, and left peroneal veins.                   - Findings consistent with acute superficial vein thrombosis                   involving the                   left small saphenous vein.                   - Findings consistent  with age indeterminate deep vein                   thrombosis                   involving the left popliteal vein, and left gastrocnemius                   veins.                   - Findings consistent with chronic deep vein thrombosis                   involving the left                   common femoral vein, SF junction, and left femoral vein.                   - No cystic structure found in the popliteal fossa. Performing Technologist: Chanda BusingGregory Collins RVT  Examination Guidelines: A complete evaluation includes B-mode imaging, spectral Doppler, color Doppler, and power  Doppler as needed of all accessible portions of each vessel. Bilateral testing is considered an integral part of a complete examination. Limited examinations for reoccurring indications may be performed as noted. The reflux portion of the exam is performed with the patient in reverse Trendelenburg.  +---------+---------------+---------+-----------+----------+--------------+ RIGHT    CompressibilityPhasicitySpontaneityPropertiesThrombus Aging +---------+---------------+---------+-----------+----------+--------------+ CFV      Full           Yes      Yes                                 +---------+---------------+---------+-----------+----------+--------------+ SFJ      Full                                                        +---------+---------------+---------+-----------+----------+--------------+ FV Prox  Full                                                        +---------+---------------+---------+-----------+----------+--------------+ FV Mid   Full                                                        +---------+---------------+---------+-----------+----------+--------------+ FV DistalFull                                                        +---------+---------------+---------+-----------+----------+--------------+ PFV      Full                                                         +---------+---------------+---------+-----------+----------+--------------+ POP      Full           Yes      Yes                                 +---------+---------------+---------+-----------+----------+--------------+ PTV      Full                                                        +---------+---------------+---------+-----------+----------+--------------+ PERO     Full                                                        +---------+---------------+---------+-----------+----------+--------------+   +---------+---------------+---------+-----------+----------+-----------------+  LEFT     CompressibilityPhasicitySpontaneityPropertiesThrombus Aging    +---------+---------------+---------+-----------+----------+-----------------+ CFV      Full           Yes      Yes                                    +---------+---------------+---------+-----------+----------+-----------------+ SFJ      Full                                                           +---------+---------------+---------+-----------+----------+-----------------+ FV Prox  Full                                                           +---------+---------------+---------+-----------+----------+-----------------+ FV Mid   Partial        Yes      Yes                  Age Indeterminate +---------+---------------+---------+-----------+----------+-----------------+ FV DistalFull           Yes      Yes                                    +---------+---------------+---------+-----------+----------+-----------------+ PFV      Full                                                           +---------+---------------+---------+-----------+----------+-----------------+ POP      Full           Yes      Yes                                    +---------+---------------+---------+-----------+----------+-----------------+ PTV      Full                                                            +---------+---------------+---------+-----------+----------+-----------------+ PERO     Full                                                           +---------+---------------+---------+-----------+----------+-----------------+     Summary: RIGHT: - There is no evidence of deep vein thrombosis in the lower extremity.  - No cystic structure found in the popliteal fossa.  LEFT: - Findings consistent with age indeterminate deep vein thrombosis involving the left femoral vein. - No cystic  structure found in the popliteal fossa.  *See table(s) above for measurements and observations.    Preliminary     Procedures Procedures   Medications Ordered in ED Medications - No data to display  ED Course  I have reviewed the triage vital signs and the nursing notes.  Pertinent labs & imaging results that were available during my care of the patient were reviewed by me and considered in my medical decision making (see chart for details).  Patient seen and examined. Plan discussed with patient.   Labs: Performed in triage, overall reassuring.  Minimal elevation in lipase.  Normal white blood cell count.  Slightly elevated hemoglobin.  No compelling UTI.  Imaging: Ultrasound shows age-indeterminate left lower extremity DVT, this is likely residual from her acute DVT noted in August.  Medications/Fluids: We will provide prescription for Xarelto and give another dose here.  For her abdominal symptoms, will give as needed Bentyl and GI follow-up.  Vital signs reviewed and are as follows: BP 130/69 (BP Location: Left Arm)   Pulse 91   Temp 97.8 F (36.6 C)   Resp 15   SpO2 96%   The patient was urged to return to the Emergency Department immediately with worsening of current symptoms, worsening abdominal pain, persistent vomiting, blood noted in stools, fever, or any other concerns. The patient verbalized understanding.      MDM Rules/Calculators/A&P                           Left lower  extremity DVT: Patient is out of Xarelto.  Has only been out for 2 days.  No clinical signs of PE.  Will restart and have patient follow-up with her doctors to decide if to discontinue this.  Abdominal symptoms: Subacute, no focal abdominal tenderness at time of exam.  Labs are reassuring.  No concern for pancreatitis given no left upper quadrant pain or other symptoms of such.  Patient was concerned about UTI, no compelling UTI on UA.  Will treat with Bentyl and give GI follow-up.  Do not feel that patient requires imaging or further work-up emergently.  Return instructions as above.     Final Clinical Impression(s) / ED Diagnoses Final diagnoses:  Chronic deep vein thrombosis (DVT) of distal vein of left lower extremity (HCC)  Medication refill  Abdominal cramping    Rx / DC Orders ED Discharge Orders          Ordered    dicyclomine (BENTYL) 20 MG tablet  2 times daily,   Status:  Discontinued        11/22/21 1052    dicyclomine (BENTYL) 20 MG tablet  2 times daily        11/22/21 1054             Carlisle Cater, PA-C 11/22/21 1055    Isla Pence, MD 11/22/21 1121

## 2021-11-22 NOTE — ED Notes (Signed)
Patient refused her vitals

## 2021-11-22 NOTE — ED Notes (Signed)
Pt refuse update vital signs

## 2021-12-06 IMAGING — CT CT ABD-PELV W/ CM
2 of 5 series · 16 of 46 positions shown, 18 images · IV contrast (OMNIPAQUE 350)
Comparison: None.

CLINICAL DATA: Left lower quadrant abdominal pain, abdominal
distension

EXAM:
CT ABDOMEN AND PELVIS WITH CONTRAST
TECHNIQUE: Multidetector CT imaging of the abdomen and pelvis was performed
using the standard protocol following bolus administration of
intravenous contrast.
CONTRAST:  80mL OMNIPAQUE IOHEXOL 350 MG/ML SOLN

[Series 2: axial st · axial · 0.71mm/px · z∈[-423,-48]mm · 13 of 87 slices shown, 15 images]
[im 6/87  soft-tissue]
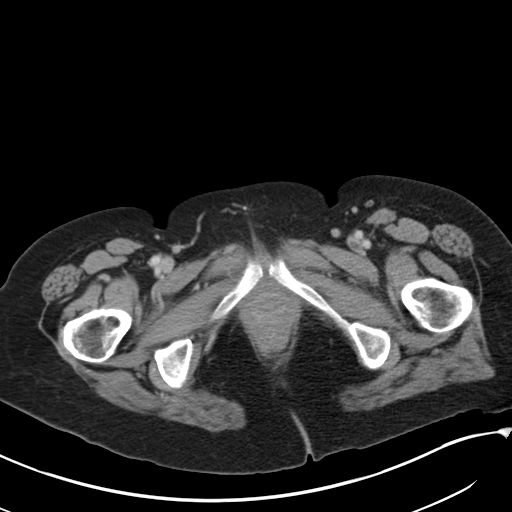
[im 6/87  bone]
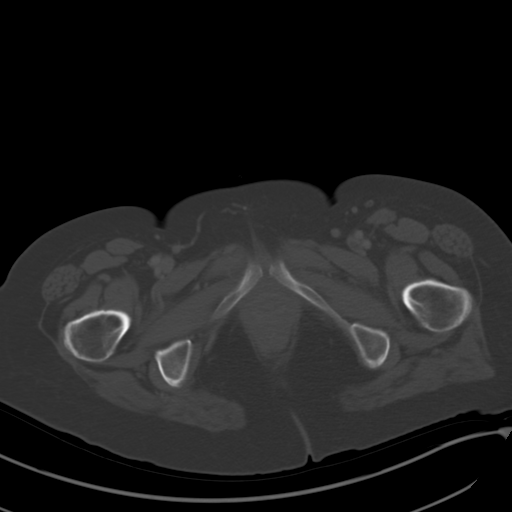
[im 11/87  soft-tissue]
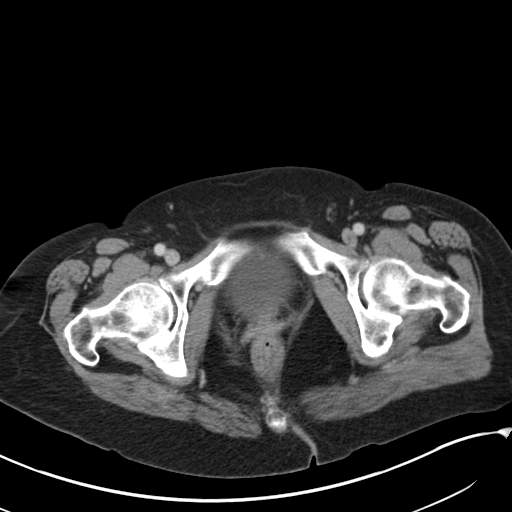
[im 17/87  soft-tissue]
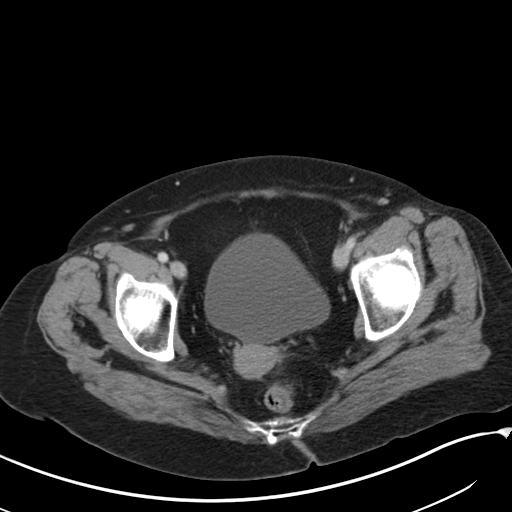
[im 27/87  soft-tissue]
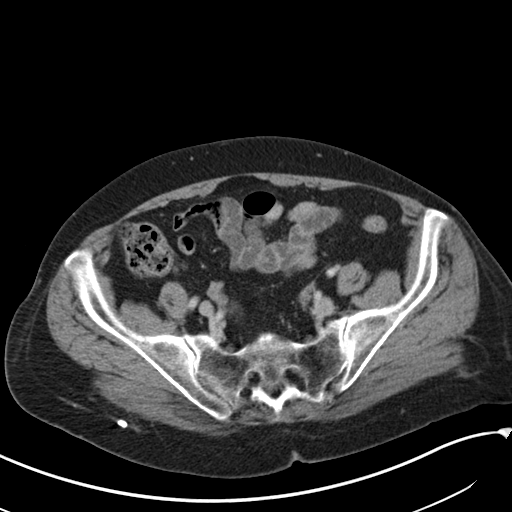
[im 33/87  soft-tissue]
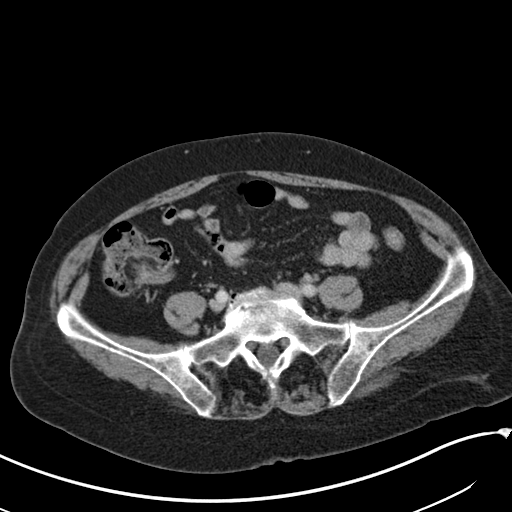
[im 38/87  soft-tissue]
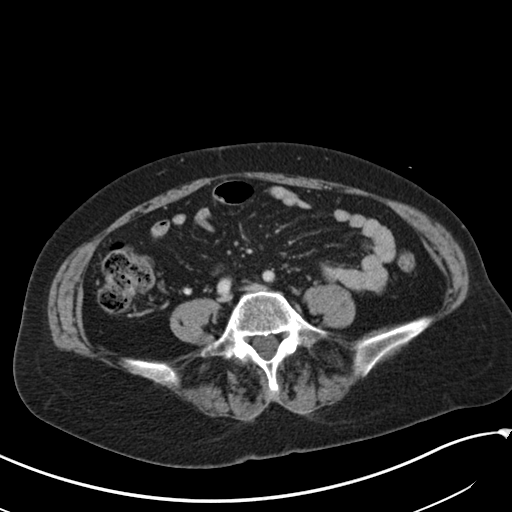
[im 44/87  soft-tissue]
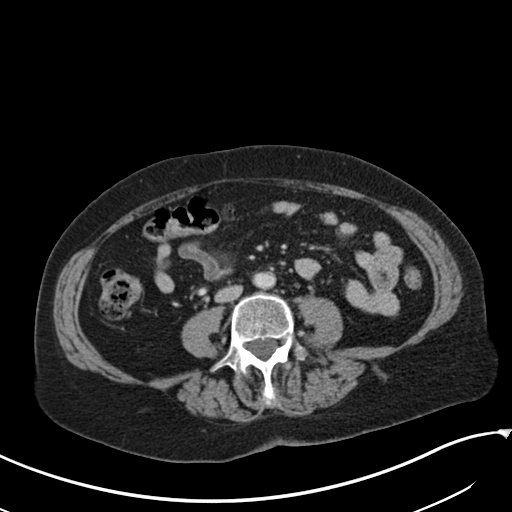
[im 49/87  soft-tissue]
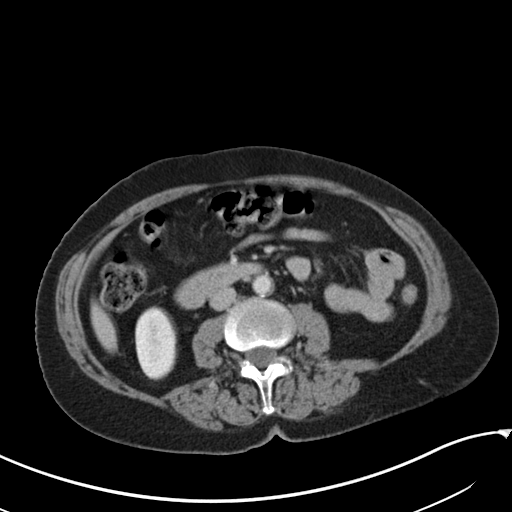
[im 54/87  soft-tissue]
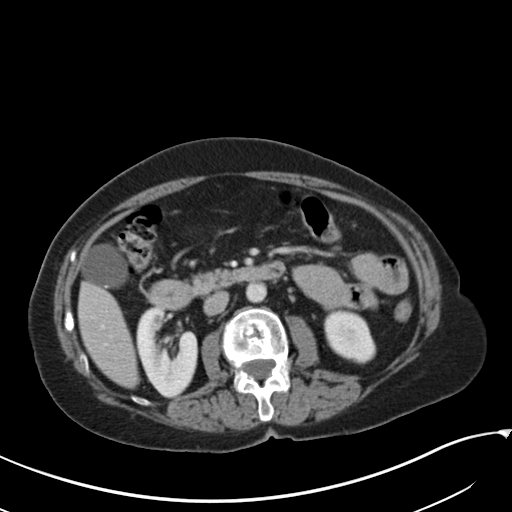
[im 54/87  bone]
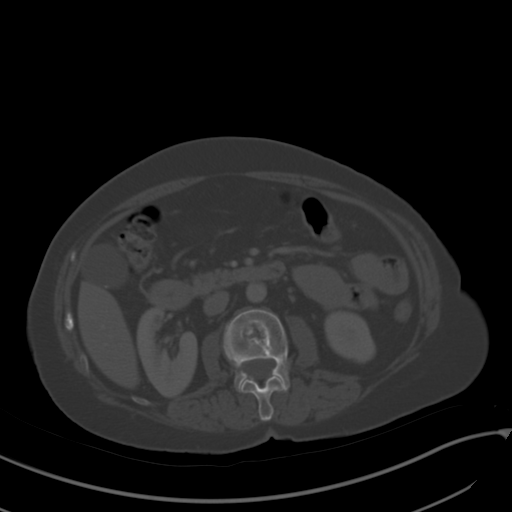
[im 60/87  soft-tissue]
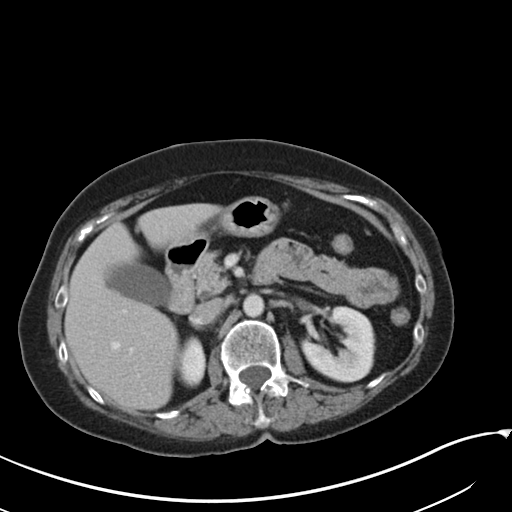
[im 70/87  soft-tissue]
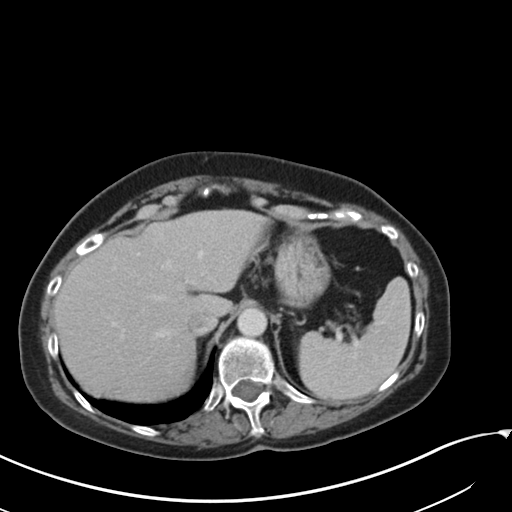
[im 76/87  soft-tissue]
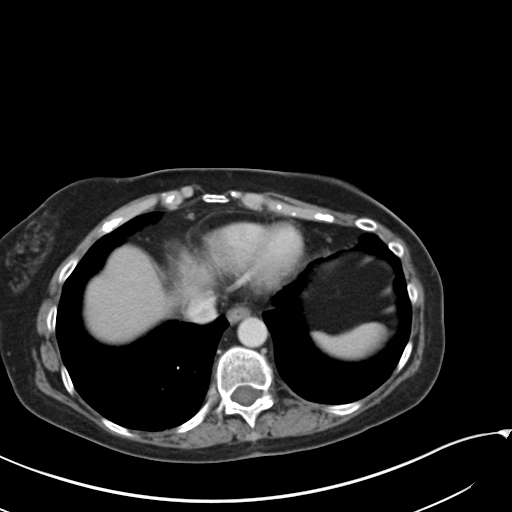
[im 81/87  soft-tissue]
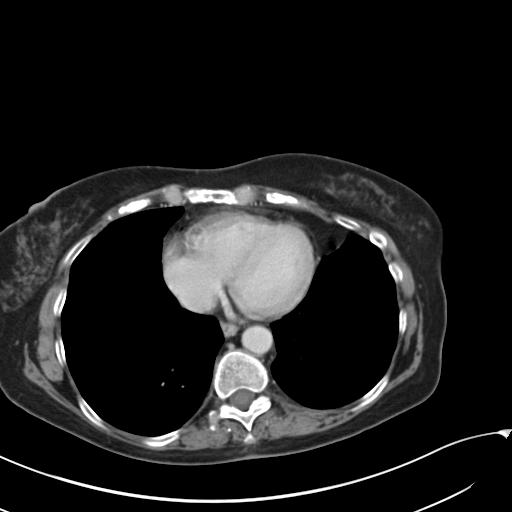

[Series 4: coronal st · coronal · 0.65mm/px · 3 of 107 slices shown]
[im 36/107  soft-tissue]
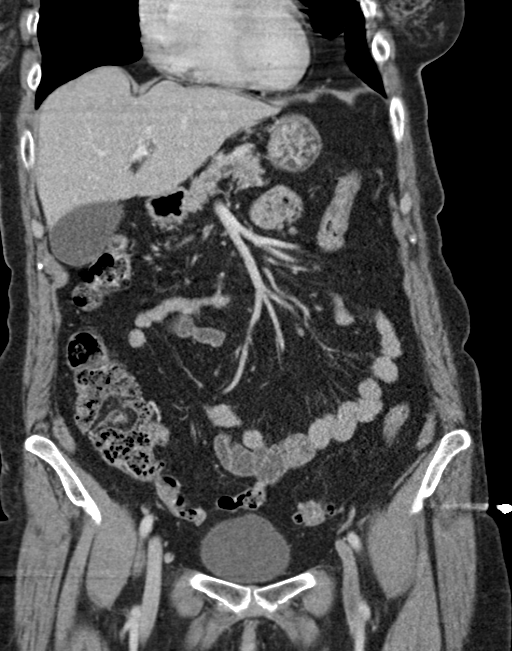
[im 48/107  soft-tissue]
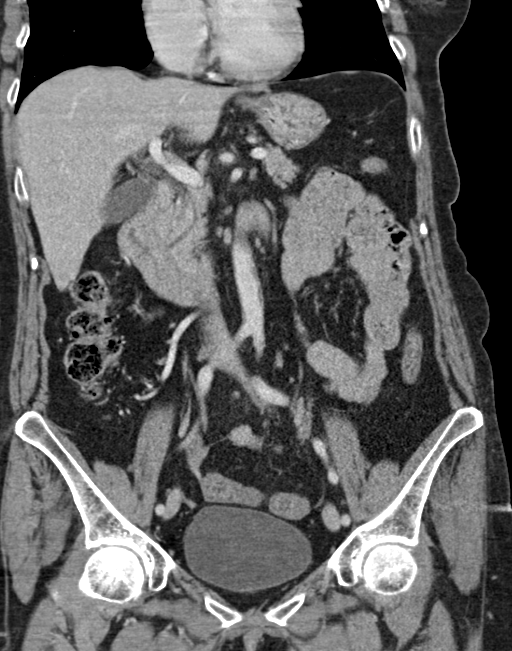
[im 59/107  soft-tissue]
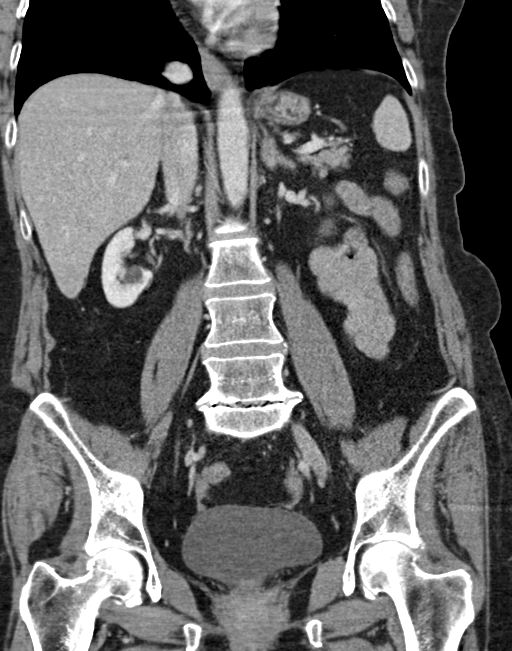

[16 of 46 positions shown; findings below may reference images not displayed]

FINDINGS: Lower chest: No acute abnormality.

Hepatobiliary: Cholelithiasis without pericholecystic inflammatory
change. Liver unremarkable. No intra or extrahepatic biliary ductal
dilation.

Pancreas: Unremarkable

Spleen: Unremarkable

Adrenals/Urinary Tract: The adrenal glands are unremarkable. The
kidneys are normal in size and position. Simple cortical cyst noted
within the lower pole the right kidney. The kidneys are otherwise
unremarkable. Bladder unremarkable.

Stomach/Bowel: Mild sigmoid diverticulosis. The stomach, small
bowel, and large bowel are otherwise unremarkable. Appendix normal.
No free intraperitoneal gas or fluid.

Vascular/Lymphatic: Mild atherosclerotic calcification within the
abdominal aorta. No aortic aneurysm. There is a central filling
defect seen within the left common femoral vein suspicious for acute
DVT. Less likely, this may represent mixing artifact from inflow of
unopacified blood. No pathologic adenopathy within the abdomen and
pelvis.

Reproductive: Uterus and bilateral adnexa are unremarkable.

Other: No abdominal wall hernia.  Rectum unremarkable.

Musculoskeletal: No acute bone abnormality. Remote superior endplate
fracture of L3. No lytic or blastic bone lesion.
IMPRESSION: Mixing artifact versus acute DVT within the left common femoral
vein. Correlation for history of DVT is recommended. Lower extremity
Doppler sonography may be helpful for further evaluation.

No acute intra-abdominal pathology identified. No definite
radiographic explanation for the patient's reported symptoms.

Cholelithiasis.

Mild sigmoid diverticulosis.

Aortic Atherosclerosis ([id number]-IWO.O).

## 2022-01-15 ENCOUNTER — Ambulatory Visit (HOSPITAL_COMMUNITY)
Admission: EM | Admit: 2022-01-15 | Discharge: 2022-01-15 | Disposition: A | Discharge status: Home / Self Care | Payer: Self-pay | Attending: Internal Medicine | Admitting: Internal Medicine

## 2022-01-15 ENCOUNTER — Other Ambulatory Visit: Payer: Self-pay

## 2022-01-15 DIAGNOSIS — G4709 Other insomnia: Secondary | ICD-10-CM | POA: Insufficient documentation

## 2022-01-15 LAB — CBC WITH DIFFERENTIAL/PLATELET
Abs Immature Granulocytes: 0.08 10*3/uL — ABNORMAL HIGH (ref 0.00–0.07)
Basophils Absolute: 0.1 10*3/uL (ref 0.0–0.1)
Basophils Relative: 0 %
Eosinophils Absolute: 0 10*3/uL (ref 0.0–0.5)
Eosinophils Relative: 0 %
HCT: 48.1 % — ABNORMAL HIGH (ref 36.0–46.0)
Hemoglobin: 15.9 g/dL — ABNORMAL HIGH (ref 12.0–15.0)
Immature Granulocytes: 0 %
Lymphocytes Relative: 15 %
Lymphs Abs: 3.2 10*3/uL (ref 0.7–4.0)
MCH: 29 pg (ref 26.0–34.0)
MCHC: 33.1 g/dL (ref 30.0–36.0)
MCV: 87.6 fL (ref 80.0–100.0)
Monocytes Absolute: 1.5 10*3/uL — ABNORMAL HIGH (ref 0.1–1.0)
Monocytes Relative: 7 %
Neutro Abs: 16.4 10*3/uL — ABNORMAL HIGH (ref 1.7–7.7)
Neutrophils Relative %: 78 %
Platelets: 312 10*3/uL (ref 150–400)
RBC: 5.49 MIL/uL — ABNORMAL HIGH (ref 3.87–5.11)
RDW: 14 % (ref 11.5–15.5)
WBC: 21.2 10*3/uL — ABNORMAL HIGH (ref 4.0–10.5)
nRBC: 0 % (ref 0.0–0.2)

## 2022-01-15 LAB — COMPREHENSIVE METABOLIC PANEL
ALT: 15 U/L (ref 0–44)
AST: 18 U/L (ref 15–41)
Albumin: 4 g/dL (ref 3.5–5.0)
Alkaline Phosphatase: 59 U/L (ref 38–126)
Anion gap: 9 (ref 5–15)
BUN: 10 mg/dL (ref 8–23)
CO2: 26 mmol/L (ref 22–32)
Calcium: 9.6 mg/dL (ref 8.9–10.3)
Chloride: 105 mmol/L (ref 98–111)
Creatinine, Ser: 1.03 mg/dL — ABNORMAL HIGH (ref 0.44–1.00)
GFR, Estimated: 60 mL/min — ABNORMAL LOW (ref >60)
Glucose, Bld: 89 mg/dL (ref 70–99)
Potassium: 4.3 mmol/L (ref 3.5–5.1)
Sodium: 140 mmol/L (ref 135–145)
Total Bilirubin: 0.5 mg/dL (ref 0.3–1.2)
Total Protein: 7.5 g/dL (ref 6.5–8.1)

## 2022-01-15 MED ORDER — TRAZODONE HCL 50 MG PO TABS: 50.0000 mg | ORAL_TABLET | Freq: Every evening | ORAL | 0 refills | Status: DC | PRN

## 2022-01-15 NOTE — ED Provider Notes (Signed)
Loretta Copeland    CSN: [id number] Arrival date & time: 01/15/22  1429      History   Chief Complaint Chief Complaint  Patient presents with   Abdominal Pain   Fatigue    HPI Loretta Copeland is a 66 y.o. female with multiple medical problems ranging from depression, DVT on anticoagulation therapy, migraines comes to urgent care with complaints of inability to sleep in worsening fatigue.  Patient says symptoms have been worsening over the past couple of months.  Appetite is poor.  Patient has not followed up with primary care physician because of her insurance issues.  She will be on Medicaid from February 1.  Patient is in the urgent care requesting lab work and medication to help her sleep.  Clinic encounter was done through a translator.  Patient denies weight loss.  No suicidal or homicidal ideation.  Patient has left lower quadrant abdominal discomfort and a change in his stool character.  No blood in stools.  Patient says that his stool is dark.  No fever or chills. HPI  Past Medical History:  Diagnosis Date   Arthritis    Depression    DVT (deep venous thrombosis) (HCC)    Migraines    Thrombophlebitis 11/2016    Patient Active Problem List   Diagnosis Date Noted   Thrombophlebitis 11/15/2016   Sinusitis, chronic 09/07/2016   Smoking 11/06/2015    No past surgical history on file.  OB History   No obstetric history on file.      Home Medications    Prior to Admission medications   Medication Sig Start Date End Date Taking? Authorizing Provider  traZODone (DESYREL) 50 MG tablet Take 1 tablet (50 mg total) by mouth at bedtime as needed for sleep. 01/15/22  Yes Gianlucas Evenson, Myrene Galas, MD  albuterol (PROVENTIL HFA;VENTOLIN HFA) 108 (90 Base) MCG/ACT inhaler Inhale 2 puffs into the lungs every 4 (four) hours as needed for wheezing or shortness of breath. 12/10/18   Vanessa Kick, MD  azithromycin (ZITHROMAX) 250 MG tablet Take 1 tablet (250 mg total) by mouth  daily. Take first 2 tablets together, then 1 every day until finished. 08/21/21   Hans Eden, NP  cetirizine (ZYRTEC ALLERGY) 10 MG tablet Take 1 tablet (10 mg total) by mouth daily. 08/21/20   Jaynee Eagles, PA-C  cyclobenzaprine (FLEXERIL) 5 MG tablet Take 1 tablet (5 mg total) by mouth at bedtime. 04/13/21   Augusto Gamble B, NP  diclofenac sodium (VOLTAREN) 1 % GEL Apply 2 g topically 4 (four) times daily. 05/04/16   Robyn Haber, MD  dicyclomine (BENTYL) 20 MG tablet Take 1 tablet (20 mg total) by mouth 2 (two) times daily. 11/22/21   Carlisle Cater, PA-C  erythromycin (E-MYCIN) 250 MG tablet Take 1 tablet (250 mg total) by mouth 4 (four) times daily. 02/01/21   Ripley Fraise, MD  fluticasone (FLONASE) 50 MCG/ACT nasal spray Place 1 spray into both nostrils daily. 08/21/21   White, Leitha Schuller, NP  LORazepam (ATIVAN) 2 MG tablet Take 2 mg by mouth 3 (three) times daily as needed. 08/14/21   [provider]  predniSONE (DELTASONE) 20 MG tablet Take 2 tablets (40 mg total) by mouth daily. 08/21/21   Hans Eden, NP  XARELTO 20 MG TABS tablet TAKE 1 TABLET(20 MG) BY MOUTH DAILY WITH SUPPER 11/22/21   Cherre Robins, MD    Family History Family History  Problem Relation Age of Onset   Diabetes Mother  Diabetes Father    Diabetes Brother    Diabetes Daughter     Social History Social History   Tobacco Use   Smoking status: Every Day    Packs/day: 1.00    Years: 30.00    Pack years: 30.00    Types: Cigarettes   Smokeless tobacco: Never   Tobacco comments:    not smoking every day  Vaping Use   Vaping Use: Never used  Substance Use Topics   Alcohol use: No    Alcohol/week: 0.0 standard drinks   Drug use: No     Allergies   Penicillins and Tetracyclines & related   Review of Systems Review of Systems As per HPI  Physical Exam Triage Vital Signs ED Triage Vitals [01/15/22 1554]  Enc Vitals Group     BP (!) 141/79     Pulse Rate (!) 101     Resp 18      Temp 98.1 F (36.7 C)     Temp src      SpO2 94 %     Weight      Height      Head Circumference      Peak Flow      Pain Score      Pain Loc      Pain Edu?      Excl. in GC?    No data found.  Updated Vital Signs BP (!) 141/79 (BP Location: Left Arm)    Pulse (!) 101    Temp 98.1 F (36.7 C)    Resp 18    SpO2 94%   Visual Acuity Right Eye Distance:   Left Eye Distance:   Bilateral Distance:    Right Eye Near:   Left Eye Near:    Bilateral Near:     Physical Exam Vitals and nursing note reviewed.  Constitutional:      General: She is not in acute distress.    Appearance: She is ill-appearing. She is not toxic-appearing or diaphoretic.  Cardiovascular:     Rate and Rhythm: Normal rate and regular rhythm.  Pulmonary:     Effort: Pulmonary effort is normal. No respiratory distress.     Breath sounds: Normal breath sounds. No wheezing or rhonchi.  Abdominal:     General: Abdomen is flat. Bowel sounds are normal.     Palpations: Abdomen is soft. There is no shifting dullness, fluid wave, hepatomegaly or splenomegaly.     Tenderness: There is no abdominal tenderness.  Neurological:     Mental Status: She is alert.     UC Treatments / Results  Labs (all labs ordered are listed, but only abnormal results are displayed) Labs Reviewed  CBC WITH DIFFERENTIAL/PLATELET - Abnormal; Notable for the following components:      Result Value   WBC 21.2 (*)    RBC 5.49 (*)    Hemoglobin 15.9 (*)    HCT 48.1 (*)    Neutro Abs 16.4 (*)    Monocytes Absolute 1.5 (*)    Abs Immature Granulocytes 0.08 (*)    All other components within normal limits  COMPREHENSIVE METABOLIC PANEL    EKG   Radiology No results found.  Procedures Procedures (including critical care time)  Medications Ordered in UC Medications - No data to display  Initial Impression / Assessment and Plan / UC Course  I have reviewed the triage vital signs and the nursing notes.  Pertinent  labs & imaging results that were available during my  care of the patient were reviewed by me and considered in my medical decision making (see chart for details).     1.  Insomnia: Trazodone as needed for sleep CBC, CMP. Patient is advised to follow-up with primary care physician to address her multiple medical problems.  Patient verbalizes understanding. We will call patient with recommendations if labs are abnormal. Final Clinical Impressions(s) / UC Diagnoses   Final diagnoses:  Other insomnia     Discharge Instructions      Please follow-up with your primary care physician to address the multiple complaints that you came in with. I have prescribed trazodone which will help with sleep We will call you with recommendations if labs are abnormal.     ED Prescriptions     Medication Sig Dispense Auth. Provider   traZODone (DESYREL) 50 MG tablet Take 1 tablet (50 mg total) by mouth at bedtime as needed for sleep. 30 tablet Rashid Whitenight, Myrene Galas, MD      PDMP not reviewed this encounter.   Chase Picket, MD 01/15/22 [phone]

## 2022-01-15 NOTE — Discharge Instructions (Addendum)
Please follow-up with your primary care physician to address the multiple complaints that you came in with. I have prescribed trazodone which will help with sleep We will call you with recommendations if labs are abnormal.

## 2022-01-15 NOTE — ED Triage Notes (Signed)
Pt has arthritis, has problems with her lungs-asthma. States arms and legs have been weak for 2 months. Pt also c/o stomach being swollen on left side since August last year. Pt recently getting insurance

## 2022-01-16 ENCOUNTER — Telehealth (HOSPITAL_COMMUNITY): Payer: Self-pay | Admitting: Emergency Medicine

## 2022-01-16 NOTE — Telephone Encounter (Signed)
Patient is being contacted post appointment from the Urgent Care and sent to the Emergency Department via private vehicle . Per Dr. Lanny Cramp, patient is in need of higher level of care due to WBC of >20 with LLQ tenderness and change in stool quality.

## 2022-01-30 ENCOUNTER — Encounter: Payer: Self-pay | Admitting: Gastroenterology

## 2022-02-03 ENCOUNTER — Emergency Department (HOSPITAL_COMMUNITY): Payer: Medicare Other

## 2022-02-03 ENCOUNTER — Emergency Department (HOSPITAL_COMMUNITY)
Admission: EM | Admit: 2022-02-03 | Discharge: 2022-02-03 | Disposition: A | Discharge status: Home / Self Care | Payer: Medicare Other | Attending: Emergency Medicine | Admitting: Emergency Medicine

## 2022-02-03 ENCOUNTER — Encounter (HOSPITAL_COMMUNITY): Payer: Self-pay | Admitting: Emergency Medicine

## 2022-02-03 ENCOUNTER — Other Ambulatory Visit: Payer: Self-pay

## 2022-02-03 DIAGNOSIS — R6889 Other general symptoms and signs: Secondary | ICD-10-CM

## 2022-02-03 DIAGNOSIS — Z20822 Contact with and (suspected) exposure to covid-19: Secondary | ICD-10-CM | POA: Diagnosis not present

## 2022-02-03 DIAGNOSIS — R1032 Left lower quadrant pain: Secondary | ICD-10-CM | POA: Insufficient documentation

## 2022-02-03 DIAGNOSIS — F17210 Nicotine dependence, cigarettes, uncomplicated: Secondary | ICD-10-CM | POA: Diagnosis not present

## 2022-02-03 DIAGNOSIS — Z7901 Long term (current) use of anticoagulants: Secondary | ICD-10-CM | POA: Diagnosis not present

## 2022-02-03 LAB — CBC
HCT: 48.5 % — ABNORMAL HIGH (ref 36.0–46.0)
Hemoglobin: 16.2 g/dL — ABNORMAL HIGH (ref 12.0–15.0)
MCH: 29.3 pg (ref 26.0–34.0)
MCHC: 33.4 g/dL (ref 30.0–36.0)
MCV: 87.9 fL (ref 80.0–100.0)
Platelets: 282 10*3/uL (ref 150–400)
RBC: 5.52 MIL/uL — ABNORMAL HIGH (ref 3.87–5.11)
RDW: 13.2 % (ref 11.5–15.5)
WBC: 9 10*3/uL (ref 4.0–10.5)
nRBC: 0 % (ref 0.0–0.2)

## 2022-02-03 LAB — COMPREHENSIVE METABOLIC PANEL
ALT: 13 U/L (ref 0–44)
AST: 18 U/L (ref 15–41)
Albumin: 3.7 g/dL (ref 3.5–5.0)
Alkaline Phosphatase: 51 U/L (ref 38–126)
Anion gap: 10 (ref 5–15)
BUN: 10 mg/dL (ref 8–23)
CO2: 23 mmol/L (ref 22–32)
Calcium: 9.2 mg/dL (ref 8.9–10.3)
Chloride: 102 mmol/L (ref 98–111)
Creatinine, Ser: 0.97 mg/dL (ref 0.44–1.00)
GFR, Estimated: >60 mL/min (ref >60)
Glucose, Bld: 113 mg/dL — ABNORMAL HIGH (ref 70–99)
Potassium: 4 mmol/L (ref 3.5–5.1)
Sodium: 135 mmol/L (ref 135–145)
Total Bilirubin: 0.6 mg/dL (ref 0.3–1.2)
Total Protein: 7 g/dL (ref 6.5–8.1)

## 2022-02-03 LAB — URINALYSIS, MICROSCOPIC (REFLEX)

## 2022-02-03 LAB — URINALYSIS, ROUTINE W REFLEX MICROSCOPIC
Bilirubin Urine: NEGATIVE
Glucose, UA: NEGATIVE mg/dL
Hgb urine dipstick: NEGATIVE
Ketones, ur: NEGATIVE mg/dL
Nitrite: NEGATIVE
Protein, ur: NEGATIVE mg/dL
Specific Gravity, Urine: 1.015 (ref 1.005–1.030)
pH: 6.5 (ref 5.0–8.0)

## 2022-02-03 LAB — RESP PANEL BY RT-PCR (FLU A&B, COVID) ARPGX2
Influenza A by PCR: NEGATIVE
Influenza B by PCR: NEGATIVE
SARS Coronavirus 2 by RT PCR: NEGATIVE

## 2022-02-03 LAB — LIPASE, BLOOD: Lipase: 95 U/L — ABNORMAL HIGH (ref 11–51)

## 2022-02-03 MED ORDER — ALBUTEROL SULFATE HFA 108 (90 BASE) MCG/ACT IN AERS
2.0000 | INHALATION_SPRAY | Freq: Once | RESPIRATORY_TRACT | Status: AC
  Administered 2022-02-03: 2 via RESPIRATORY_TRACT
  Filled 2022-02-03: qty 6.7

## 2022-02-03 NOTE — Discharge Instructions (Addendum)
For your chronic sinus infection, you do not meet criteria for antibiotics at this time.  We recommend continuing your Flonase spray at home.  I will provide you with a refill of this medication.  Please call [phone] to establish with [facility].  For your abdominal pain, no need for imaging at this time.  The white blood cell count that was previously elevated is normal today.  Please follow-up with your GI doctor on Friday at your regularly scheduled visit.  For your asthma, we will give you an albuterol inhaler to take home which she should use as needed for wheezing.  Your chest x-ray did not show a pneumonia, you do not need antibiotics for pneumonia at this time.  Your urine sample did not show urinary tract infection, thus you do not need antibiotics for a UTI.

## 2022-02-03 NOTE — ED Provider Notes (Signed)
[facility] EMERGENCY DEPARTMENT Provider Note  History   Chief Complaint  Patient presents with   Abdominal Pain   abnormal labs   The history is provided by the patient and the spouse.  Abdominal Pain Pain location:  LLQ Pain quality: bloating and sharp   Pain radiates to:  Does not radiate Pain severity:  Mild Onset quality:  Gradual Duration: 6 months. Timing:  Intermittent Progression:  Unchanged Chronicity:  Chronic Context: not alcohol use, not diet changes, not medication withdrawal, not previous surgeries, not recent illness, not recent sexual activity and not recent travel   Relieved by:  Nothing Worsened by:  Nothing Ineffective treatments:  Acetaminophen Associated symptoms: no anorexia, no chest pain, no chills, no constipation, no dysuria, no fever, no hematemesis, no hematuria, no melena, no shortness of breath, no sore throat and no vomiting   Risk factors: being elderly   Risk factors: no alcohol abuse, has not had multiple surgeries, not pregnant and no recent hospitalization     Past Medical History:  Diagnosis Date   Arthritis    Depression    DVT (deep venous thrombosis) (HCC)    Migraines    Thrombophlebitis 11/2016    Social History   Tobacco Use   Smoking status: Every Day    Packs/day: 1.00    Years: 30.00    Pack years: 30.00    Types: Cigarettes   Smokeless tobacco: Never   Tobacco comments:    not smoking every day  Vaping Use   Vaping Use: Never used  Substance Use Topics   Alcohol use: No    Alcohol/week: 0.0 standard drinks   Drug use: No     Family History  Problem Relation Age of Onset   Diabetes Mother    Diabetes Father    Diabetes Brother    Diabetes Daughter     Review of Systems  Constitutional:  Negative for chills and fever.  HENT:  Negative for congestion and sore throat.   Eyes:  Negative for photophobia and visual disturbance.  Respiratory:  Negative for shortness of breath.    Cardiovascular:  Negative for chest pain and palpitations.  Gastrointestinal:  Positive for abdominal pain. Negative for anorexia, blood in stool, constipation, hematemesis, melena and vomiting.  Endocrine: Negative.   Genitourinary:  Negative for dysuria and hematuria.  Musculoskeletal:  Negative for neck pain and neck stiffness.  Skin:  Negative for rash and wound.  Allergic/Immunologic: Negative.   Neurological: Negative.  Negative for dizziness, seizures and facial asymmetry.  Hematological: Negative.   Psychiatric/Behavioral: Negative.      Physical Exam   Today's Vitals   02/03/22 1451 02/03/22 1700 02/03/22 1749 02/03/22 1756  BP:  135/70 (!) 143/76   Pulse:  87 94   Resp:  15 18   Temp:   98.6 F (37 C)   TempSrc:   Oral   SpO2:  97% 100%   PainSc: 5    0-No pain     Physical Exam Vitals and nursing note reviewed.  Constitutional:      General: She is not in acute distress.    Appearance: She is well-developed. She is not ill-appearing or toxic-appearing.  HENT:     Head: Normocephalic and atraumatic.     Nose: Nose normal. No congestion.     Mouth/Throat:     Mouth: Mucous membranes are moist.     Pharynx: Oropharynx is clear. No oropharyngeal exudate.  Eyes:     Extraocular  Movements: Extraocular movements intact.     Pupils: Pupils are equal, round, and reactive to light.  Cardiovascular:     Rate and Rhythm: Normal rate and regular rhythm.     Pulses: Normal pulses.     Heart sounds: Normal heart sounds. No murmur heard. Pulmonary:     Effort: Pulmonary effort is normal. No respiratory distress.     Breath sounds: No stridor. Wheezing present. No rhonchi or rales.  Chest:     Chest wall: No tenderness.  Abdominal:     General: There is no distension.     Palpations: Abdomen is soft.     Tenderness: There is no abdominal tenderness. There is no right CVA tenderness, left CVA tenderness, guarding or rebound.  Musculoskeletal:        General: No  swelling.     Cervical back: Normal range of motion and neck supple. No tenderness.     Right lower leg: No edema.     Left lower leg: No edema.  Skin:    General: Skin is warm and dry.     Capillary Refill: Capillary refill takes less than 2 seconds.     Findings: No rash.  Neurological:     General: No focal deficit present.     Mental Status: She is alert and oriented to person, place, and time. Mental status is at baseline.     Cranial Nerves: No cranial nerve deficit.     Sensory: No sensory deficit.     Motor: No weakness.  Psychiatric:        Mood and Affect: Mood normal.        Behavior: Behavior normal.    ED Course  Procedures  Medical Decision Making:  Loretta Copeland is a 66 y.o. female w/ h/o DVT (on Xarelto), depression, migraines, chronic maxillary sinusitis who p/w c/f abnormal lab.   History obtained from patient (who is hard of hearing) and husband at bedside  Patient states she presented to the ED today because she "received a call that one of her blood test was elevated and was told to present to the ED for evaluation." Upon chart review, it appears she was seen on 1/31 for insomnia and fatigue, found to have LLQ abdominal discomfort and "change in her stools."  Urgent care obtained a CBC which later revealed WBC >20.  A telephone note documented on 2/1 notes a voicemail was left with patient noting that she present to the ED for evaluation in the setting of leukocytosis.  Now (18 days later) she presents to the ED for evaluation.  She states she has had ongoing LLQ abdominal pain since August with intermittent distention and intermittent nausea.  She has been able to tolerate p.o., has had regular bowel movements, is passing gas on examination.  She denies fevers.  And on examination she reports LUQ abdominal pain but has no focal tenderness to palpation, no rebound or guarding.  She denies dysuria or hematuria.  Denies bright red blood in stool.  Occasionally reports  dark stool.  The patient reports she has outpatient GI follow-up on Friday.  Additionally, the patient notes she has had a history of chronic maxillary sinusitis for which she was seen recently in urgent care and prescribed Flonase, prednisone, and azithromycin.  She states she has been struggling with chronic maxillary sinusitis since she was 66 years old.  Initially she was scheduled to see ENT in March, but previously did not have insurance and canceled this appointment.  Now patient reports that she has insurance and would like to reestablish with ENT to address this.  We can provide her with information to connect with ENT.  On examination, she has slight RUL wheezing, otherwise lungs are clear.  She endorses cough intermittently.  No history of COPD.  No hypoxia on examination.  She states she is run out of her albuterol inhaler.  We can administer albuterol in the ED, and send her home with an MDI and PCP follow-up.  Obtain CXR to rule out PNA.  Obtain the following work-up as below.  If UA without UTI and CXR without PNA, no need for antibiotics at this time.  Recommend utilizing Flonase for sinus, and will provide information for ENT follow-up.  Regarding her chronic abdominal pain, no need for imaging at this time, leukocytosis has resolved, patient has established GI outpatient follow-up on Friday.  ER provider interpretation of Imaging / Radiology:  CXR without PNA  ER provider interpretation of EKG:  Not indicated  ER provider interpretation of Labs:  CBC with WBC 9.0, Hgb 16.2 CMP without electrolyte abnormality, no AKI, no elevated LFTs Lipase 95 UA with trace leukocytes, negative nitrites, no elevated WBC, rare bacteria COVID/flu negative  Key medications administered in the ER:  Medications  albuterol (VENTOLIN HFA) 108 (90 Base) MCG/ACT inhaler 2 puff (2 puffs Inhalation Given 02/03/22 1755)   Diagnoses considered:  Etiology of chronic LLQ pain unknown at this time, has  outpatient GI f/u next week. With presenting history and physical exam, doubt bowel obstruction, AAA, ACS, PNA, PTX, pyelonephritis, nephrolithiasis, pancreatitis, cholecystitis, shingles, perforated bowel or ulcer, diverticulosis/diverticulitis, ischemic mesentery, inflammatory bowel disease, strangulated/incarcerated hernia, gastritis, PUD. Patient without peritoneal signs or other indication of need for surgical intervention.   Consulted: Not indicated  Key discharge instructions: Spoke to patient at bedside, all questions were answered at this time, close return precautions given, and patient voiced understanding and agreement with plan. Patient discharged in stable condition.   Patient seen in conjunction with Dr. Nicola Girt medical dictation software was used in the creation of this note.   Electronically signed by: Wynetta Fines, MD on 02/04/2022 at 2:27 PM  Clinical Impression:  1. Multiple complaints     Dispo: Discharge    Wynetta Fines, MD 02/05/22 1339    Lajean Saver, MD 02/07/22 1350

## 2022-02-03 NOTE — ED Notes (Signed)
Pt verbalized understanding of d/c instructions, meds, and followup care. Denies questions. VSS, no distress noted. Daughter educated over phone on home care and follow up. Pt walks out to lobby on own ability.

## 2022-02-03 NOTE — ED Triage Notes (Signed)
Husband states they were told to come to ED due to elevated labs.  Pt reports LLQ pain x 2 months.  Pt was called by [facility] nurse on 2/1 and instructed to come to ED for WBC >20 with LLQ tenderness and change in stool.

## 2022-02-05 ENCOUNTER — Ambulatory Visit: Payer: Self-pay | Admitting: Nurse Practitioner

## 2022-02-08 ENCOUNTER — Encounter: Payer: Self-pay | Admitting: Gastroenterology

## 2022-02-08 ENCOUNTER — Encounter: Payer: Self-pay | Admitting: Vascular Surgery

## 2022-02-08 ENCOUNTER — Ambulatory Visit (INDEPENDENT_AMBULATORY_CARE_PROVIDER_SITE_OTHER): Payer: Medicare Other | Admitting: Gastroenterology

## 2022-02-08 VITALS — BP 112/80 | HR 110 | Ht 63.5 in | Wt 136.5 lb

## 2022-02-08 DIAGNOSIS — Z7902 Long term (current) use of antithrombotics/antiplatelets: Secondary | ICD-10-CM | POA: Diagnosis not present

## 2022-02-08 DIAGNOSIS — R1084 Generalized abdominal pain: Secondary | ICD-10-CM | POA: Diagnosis not present

## 2022-02-08 DIAGNOSIS — R112 Nausea with vomiting, unspecified: Secondary | ICD-10-CM

## 2022-02-08 MED ORDER — PLENVU 140 G PO SOLR: 140.0000 g | ORAL | 0 refills | Status: AC

## 2022-02-08 NOTE — Patient Instructions (Addendum)
If you are age 67 or older, your body mass index should be between 23-30. Your Body mass index is 23.8 kg/m. If this is out of the aforementioned range listed, please consider follow up with your Primary Care Provider.  If you are age 33 or younger, your body mass index should be between 19-25. Your Body mass index is 23.8 kg/m. If this is out of the aformentioned range listed, please consider follow up with your Primary Care Provider.   ________________________________________________________  The [facility] providers would like to encourage you to use [facility] to communicate with providers for non-urgent requests or questions.  Due to long hold times on the telephone, sending your provider a message by [facility] may be a faster and more efficient way to get a response.  Please allow 48 business hours for a response.  Please remember that this is for non-urgent requests.  _______________________________________________________  Loretta Copeland have been scheduled for an endoscopy and colonoscopy. Please follow the written instructions given to you at your visit today. Please pick up your prep supplies at the pharmacy within the next 1-3 days. If you use inhalers (even only as needed), please bring them with you on the day of your procedure.   You will be contacted by our office prior to your procedure for directions on holding your Xarelto.  If you do not hear from our office 1 week prior to your scheduled procedure, please call [phone] to discuss.    It was a pleasure to see you today!  Thank you for trusting me with your gastrointestinal care!

## 2022-02-08 NOTE — Progress Notes (Signed)
[facility] Consult Note:  History: Loretta Copeland 02/08/2022  Referring provider: Sherald Copeland., MD  Reason for consult/chief complaint: Abdominal Pain (Cramping, pain, bloating, nausea since 8/22)   Subjective  HPI: Patient referred to [place] after 2 recent ED visits with multiple complaints including chronic left lower quadrant abdominal pain and feeling of bloating and distention.  This is a 66 year old [name] seen with her husband and the aid of a video phone interpreter.  Despite that, the patient is a challenging and tangential historian with limited health literacy as well as being quite hard of hearing.  She and her husband indicate that she has had about 6 months of abdominal pain and feelings of bloating that is difficult to characterize.  It seems primarily toward the left abdomen but sometimes upper as well.  She has intermittent nausea or vomiting.  Generally her appetite has been good and weight seems to be stable as near as they can tell.  She has not had diarrhea or rectal bleeding.  They do not recall her having similar symptoms prior to this nor any previous endoscopic testing.  I do not know all of her medicines, the only 1 she brought in a bottle today was dicyclomine, possibly prescribed after an ED visit.  It is difficult for her to say if it is helping.  I was able to determine that she has an upcoming appointment with a new [facility] PCP on March 16.  They knew about the appointment but did not know who they were seeing or why.  In addition, she continues to take Xarelto and says it is being prescribed by "the cardiologist", but cannot give any further information about that.  ROS:  Review of Systems  Constitutional:  Positive for fatigue. Negative for appetite change and unexpected weight change.  HENT:  Negative for mouth sores and voice change.   Eyes:  Negative for pain and redness.   Respiratory:  Positive for cough. Negative for shortness of breath.   Cardiovascular:  Negative for chest pain and palpitations.  Genitourinary:  Positive for frequency. Negative for dysuria and hematuria.  Musculoskeletal:  Positive for arthralgias and back pain. Negative for myalgias.  Skin:  Negative for pallor and rash.  Neurological:  Positive for headaches. Negative for weakness.  Hematological:  Negative for adenopathy.  Psychiatric/Behavioral:         Anxiety    Past Medical History: Past Medical History:  Diagnosis Date   Anxiety    Arrhythmia    Arthritis    Asthma    Depression    DVT (deep venous thrombosis) (HCC)    Hearing difficulty    Migraines    Thrombophlebitis 11/2016   Thyroid disease    Vision changes    From vascular surgery office note September 2022: " ASSESSMENT / PLAN: Loretta Copeland is a 66 y.o. 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  No indication for intervention on deep venous thrombosis.  Recommend a 79-month course of therapeutic anticoagulation.  She has not tolerated Eliquis very well so far.  We will transition her to Xarelto.  Follow-up with me as needed.   CHIEF COMPLAINT: ER follow-up for DVT   HISTORY OF PRESENT ILLNESS: Loretta Copeland is a 66 y.o. female who speaks limited [place].  She presents to clinic today with her daughter who acts as a Optometrist.  The patient has multiple complaints, none of which are related  to her left lower extremity.  She is a tangential historian.  Her daughter helps a lot with the history.  The patient presented to [facility] for evaluation of abdominal pain, distention, fever.  A CT scan was performed which was read as concerning for possible left common femoral vein deep venous thrombosis.  This was followed by duplex ultrasound which confirmed acute on chronic left lower extremity DVT.  Apparently, the patient became frustrated by the long wait time  and left before CT scan of the chest could be done to rule out pulmonary embolus.  She returned later that day but again refused an IV and a CT scan.  On my evaluation today she is in no distress.  She has multiple complaints regarding her sinuses as well as pain in her joints.  She has no pain in the left leg.  She is ambulating is much as she likes.  She has no pain when she ambulates."  Past Surgical History: History reviewed. No pertinent surgical history. No prior surgery reported  Family History: Family History  Problem Relation Age of Onset   Diabetes Mother    Diabetes Father    Diabetes Brother    Diabetes Daughter     Social History: Social History   Socioeconomic History   Marital status: Married    Spouse name: Not on file   Number of children: Not on file   Years of education: Not on file   Highest education level: Not on file  Occupational History   Not on file  Tobacco Use   Smoking status: Every Day    Packs/day: 1.00    Years: 30.00    Pack years: 30.00    Types: Cigarettes   Smokeless tobacco: Never   Tobacco comments:    not smoking every day  Vaping Use   Vaping Use: Never used  Substance and Sexual Activity   Alcohol use: No    Alcohol/week: 0.0 standard drinks   Drug use: No   Sexual activity: Never  Other Topics Concern   Not on file  Social History Narrative   Not on file   Social Determinants of Health   Financial Resource Strain: Not on file  Food Insecurity: Not on file  Transportation Needs: Not on file  Physical Activity: Not on file  Stress: Not on file  Social Connections: Not on file    Allergies: Allergies  Allergen Reactions   Penicillins    Tetracyclines & Related     Outpatient Meds: Current Outpatient Medications  Medication Sig Dispense Refill   albuterol (PROVENTIL HFA;VENTOLIN HFA) 108 (90 Base) MCG/ACT inhaler Inhale 2 puffs into the lungs every 4 (four) hours as needed for wheezing or shortness of breath. 1  Inhaler 0   cetirizine (ZYRTEC ALLERGY) 10 MG tablet Take 1 tablet (10 mg total) by mouth daily. 90 tablet 0   cyclobenzaprine (FLEXERIL) 5 MG tablet Take 1 tablet (5 mg total) by mouth at bedtime. 15 tablet 0   diclofenac sodium (VOLTAREN) 1 % GEL Apply 2 g topically 4 (four) times daily. 100 g 2   dicyclomine (BENTYL) 20 MG tablet Take 1 tablet (20 mg total) by mouth 2 (two) times daily. 20 tablet 0   erythromycin (E-MYCIN) 250 MG tablet Take 1 tablet (250 mg total) by mouth 4 (four) times daily. 28 tablet 0   fluticasone (FLONASE) 50 MCG/ACT nasal spray Place 1 spray into both nostrils daily. 11.1 mL 2   LORazepam (ATIVAN) 2  MG tablet Take 2 mg by mouth 3 (three) times daily as needed.     PEG-KCl-NaCl-NaSulf-Na Asc-C (PLENVU) 140 g SOLR Take 140 g by mouth as directed. Manufacturer's coupon Universal coupon code:BIN: C1589615; GROUP: [id number]; PCN: [id number]; IDCY:[id number]; PAY NO MORE $50 1 each 0   predniSONE (DELTASONE) 20 MG tablet Take 2 tablets (40 mg total) by mouth daily. 10 tablet 0   XARELTO 20 MG TABS tablet TAKE 1 TABLET(20 MG) BY MOUTH DAILY WITH SUPPER 91 tablet 0   No current facility-administered medications for this visit.      ___________________________________________________________________ Objective   Exam:  BP 112/80 (BP Location: Left Arm, Patient Position: Sitting, Cuff Size: Normal)    Pulse (!) 110    Ht 5' 3.5" (1.613 m)    Wt 136 lb 8 oz (61.9 kg)    SpO2 96%    BMI 23.80 kg/m  Wt Readings from Last 3 Encounters:  02/08/22 136 lb 8 oz (61.9 kg)  08/21/21 148 lb (67.1 kg)  08/08/21 143 lb 4.8 oz (65 kg)    General: Appears comfortable, not acutely ill, no muscle wasting Eyes: sclera anicteric, no redness ENT: oral mucosa moist without lesions, no cervical or supraclavicular lymphadenopathy CV: RRR without murmur, S1/S2, no JVD, no peripheral edema.  Varicose veins left greater than right, primarily on the ankle and foot Resp: clear to auscultation  bilaterally, normal RR and effort noted GI: soft, mild epigastric tenderness, with active bowel sounds. No guarding or palpable organomegaly noted.  No distention or bruit Skin; warm and dry, no rash or jaundice noted Neuro: awake, alert and oriented x 3. Normal gross motor function and fluent speech  Labs:  CBC Latest Ref Rng & Units 02/03/2022 01/15/2022 11/21/2021  WBC 4.0 - 10.5 K/uL 9.0 21.2(H) 9.3  Hemoglobin 12.0 - 15.0 g/dL 16.2(H) 15.9(H) 15.9(H)  Hematocrit 36.0 - 46.0 % 48.5(H) 48.1(H) 47.9(H)  Platelets 150 - 400 K/uL 282 312 293   CMP Latest Ref Rng & Units 02/03/2022 01/15/2022 11/21/2021  Glucose 70 - 99 mg/dL 113(H) 89 103(H)  BUN 8 - 23 mg/dL 10 10 9   Creatinine 0.44 - 1.00 mg/dL 0.97 1.03(H) 0.88  Sodium 135 - 145 mmol/L 135 140 137  Potassium 3.5 - 5.1 mmol/L 4.0 4.3 4.2  Chloride 98 - 111 mmol/L 102 105 104  CO2 22 - 32 mmol/L 23 26 23   Calcium 8.9 - 10.3 mg/dL 9.2 9.6 9.8  Total Protein 6.5 - 8.1 g/dL 7.0 7.5 7.1  Total Bilirubin 0.3 - 1.2 mg/dL 0.6 0.5 0.5  Alkaline Phos 38 - 126 U/L 51 59 62  AST 15 - 41 U/L 18 18 19   ALT 0 - 44 U/L 13 15 10      Radiologic Studies:  Narrative & Impression CLINICAL DATA:  Left lower quadrant abdominal pain, abdominal distension   EXAM: CT ABDOMEN AND PELVIS WITH CONTRAST   TECHNIQUE: Multidetector CT imaging of the abdomen and pelvis was performed using the standard protocol following bolus administration of intravenous contrast.   CONTRAST:  64mL OMNIPAQUE IOHEXOL 350 MG/ML SOLN   COMPARISON:  None.   FINDINGS: Lower chest: No acute abnormality.   Hepatobiliary: Cholelithiasis without pericholecystic inflammatory change. Liver unremarkable. No intra or extrahepatic biliary ductal dilation.   Pancreas: Unremarkable   Spleen: Unremarkable   Adrenals/Urinary Tract: The adrenal glands are unremarkable. The kidneys are normal in size and position. Simple cortical cyst noted within the lower pole the right  kidney. The kidneys are otherwise  unremarkable. Bladder unremarkable.   Stomach/Bowel: Mild sigmoid diverticulosis. The stomach, small bowel, and large bowel are otherwise unremarkable. Appendix normal. No free intraperitoneal gas or fluid.   Vascular/Lymphatic: Mild atherosclerotic calcification within the abdominal aorta. No aortic aneurysm. There is a central filling defect seen within the left common femoral vein suspicious for acute DVT. Less likely, this may represent mixing artifact from inflow of unopacified blood. No pathologic adenopathy within the abdomen and pelvis.   Reproductive: Uterus and bilateral adnexa are unremarkable.   Other: No abdominal wall hernia.  Rectum unremarkable.   Musculoskeletal: No acute bone abnormality. Remote superior endplate fracture of L3. No lytic or blastic bone lesion.   IMPRESSION: Mixing artifact versus acute DVT within the left common femoral vein. Correlation for history of DVT is recommended. Lower extremity Doppler sonography may be helpful for further evaluation.   No acute intra-abdominal pathology identified. No definite radiographic explanation for the patient's reported symptoms.   Cholelithiasis.   Mild sigmoid diverticulosis.   Aortic Atherosclerosis (ICD10-I70.0).     Electronically Signed   By: Fidela Salisbury M.D.   On: 08/09/2021 03:17 _____________________________  CLINICAL DATA:  wheezing   EXAM: CHEST - 2 VIEW   COMPARISON:  April 02, 2019.   FINDINGS: No consolidation. Similar bronchitic change. No visible pleural effusions or pneumothorax. Biapical pleuroparenchymal scarring. Cardiomediastinal silhouette is within normal limits.   IMPRESSION: Similar bronchitic change and biapical pleuroparenchymal scarring without evidence of acute cardiopulmonary disease.     Electronically Signed   By: Margaretha Sheffield M.D.   On: 02/03/2022 17:43    Assessment: Encounter Diagnoses  Name Primary?    Generalized abdominal pain Yes   Nausea and vomiting in adult    Long term (current) use of antithrombotics/antiplatelets     About 6 months of abdominal pain that is difficult to characterize.  To a lesser degree, she has intermittent nausea and vomiting, all of which is difficult to understand given the challenges noted above.  She has some chronic superior lung findings on CT and no abnormalities of the GI tract to account for her symptoms. No anemia, abnormal kidney or liver function on recent labs.  She also continues to take oral anticoagulation despite vascular surgery recommendation of a total 34-month course.  There is clearly an obstacle of language barrier, limited health literacy and lack of primary care complicating all this.  She has lost some weight in last several months based on documentation.  Plan:  I feel she needs an endoscopic work-up, though the differential is broad since it was difficult to characterize her symptoms.  I have reassured them that thus far testing does not seem to indicate any worrisome cause of abdominal pain.  I recommend an upper endoscopy and a colonoscopy.  There are likely to be challenges with this given the factors noted above.  It took considerable time to do so, but with the aid of the video phone interpreter, I explained the nature of these endoscopic procedures and the rationale as well as the risks and benefits using diagrams of the anatomy.  The benefits and risks of the planned procedure were described in detail with the patient or (when appropriate) their health care proxy.  Risks were outlined as including, but not limited to, bleeding, infection, perforation, adverse medication reaction leading to cardiac or pulmonary decompensation, pancreatitis (if ERCP).  The limitation of incomplete mucosal visualization was also discussed.  No guarantees or warranties were given. Patient at increased risk for cardiopulmonary complications  of procedure due  to medical comorbidities.  In addition, she needs to be off oral anticoagulation 2 days prior to the procedures.  However, it is not clear if she still needs to be on at all.  Therefore, we booked her procedures in April, giving [place] time for her to see her new PCP and also get an answer from vascular surgery on whether this patient should still be on [facility] or if any further vascular studies are needed to assess the clot.  Bowel preparation instructions are included with her written procedural information, and I asked her and her husband to bring them to her daughter who reportedly speaks and reads English (this was noted in prior ED visits, and the patient and her husband confirmed this through the interpreter).   50 minutes were spent on this encounter (including chart review, history/exam, counseling/coordination of care, and documentation) > 50% of that time was spent on counseling and coordination of care.   Nelida Meuse III  CC: Referring provider noted above

## 2022-02-08 NOTE — Progress Notes (Unsigned)
Patient no longer needs any form of anticoagulation from my standpoint. She may discontinue Xarelto at any time from a DVT perspective. It is safe for her to stop it for endoscopy. Please call for any questions.  Yevonne Aline. Stanford Breed, MD [facility] Phone Number: [phone] 02/08/2022 1:45 PM

## 2022-02-18 ENCOUNTER — Other Ambulatory Visit: Payer: Self-pay | Admitting: Vascular Surgery

## 2022-02-28 ENCOUNTER — Inpatient Hospital Stay: Payer: Medicare Other | Admitting: Physician Assistant

## 2022-02-28 NOTE — Progress Notes (Deleted)
Patient ID: Loretta Copeland, female   DOB: February 16, 1956, 66 y.o.   MRN: [id number] ? ? ?After ED visit 2/19 for w/up of leukocytosis(which was back to normal at time of ED visit) and abdominal pain since 07/2021.  Seen by GI 02/08/2022.  GI is planning endoscopy and colonoscopy. ? ?Medical Decision Making:  ?Loretta Copeland is a 65 y.o. female w/ h/o DVT (on Xarelto), depression, migraines, chronic maxillary sinusitis who p/w c/f abnormal lab.  ?  ?History obtained from patient (who is hard of hearing) and husband at bedside ?  ?Patient states she presented to the ED today because she "received a call that one of her blood test was elevated and was told to present to the ED for evaluation." Upon chart review, it appears she was seen on 1/31 for insomnia and fatigue, found to have LLQ abdominal discomfort and "change in her stools."  Urgent care obtained a CBC which later revealed WBC >20.  A telephone note documented on 2/1 notes a voicemail was left with patient noting that she present to the ED for evaluation in the setting of leukocytosis. ?  ?Now (18 days later) she presents to the ED for evaluation.  She states she has had ongoing LLQ abdominal pain since August with intermittent distention and intermittent nausea.  She has been able to tolerate p.o., has had regular bowel movements, is passing gas on examination.  She denies fevers.  And on examination she reports LUQ abdominal pain but has no focal tenderness to palpation, no rebound or guarding.  She denies dysuria or hematuria.  Denies bright red blood in stool.  Occasionally reports dark stool.  The patient reports she has outpatient GI follow-up on Friday. ?  ?Additionally, the patient notes she has had a history of chronic maxillary sinusitis for which she was seen recently in urgent care and prescribed Flonase, prednisone, and azithromycin.  She states she has been struggling with chronic maxillary sinusitis since she was 66 years old.  Initially she was  scheduled to see ENT in March, but previously did not have insurance and canceled this appointment.  Now patient reports that she has insurance and would like to reestablish with ENT to address this.  We can provide her with information to connect with ENT. ?  ?On examination, she has slight RUL wheezing, otherwise lungs are clear.  She endorses cough intermittently.  No history of COPD.  No hypoxia on examination.  She states she is run out of her albuterol inhaler.  We can administer albuterol in the ED, and send her home with an MDI and PCP follow-up.  Obtain CXR to rule out PNA. ?  ?Obtain the following work-up as below.  If UA without UTI and CXR without PNA, no need for antibiotics at this time.  Recommend utilizing Flonase for sinus, and will provide information for ENT follow-up.  Regarding her chronic abdominal pain, no need for imaging at this time, leukocytosis has resolved, patient has established GI outpatient follow-up on Friday. ?  ?ER provider interpretation of Imaging / Radiology:  ?CXR without PNA ? ?ER provider interpretation of Labs:  ?CBC with WBC 9.0, Hgb 16.2 ?CMP without electrolyte abnormality, no AKI, no elevated LFTs ?Lipase 95 ?UA with trace leukocytes, negative nitrites, no elevated WBC, rare bacteria ?COVID/flu negative ? ?Diagnoses considered:  Etiology of chronic LLQ pain unknown at this time, has outpatient GI f/u next week. With presenting history and physical exam, doubt bowel obstruction, AAA, ACS, PNA, PTX, pyelonephritis, nephrolithiasis, pancreatitis,  cholecystitis, shingles, perforated bowel or ulcer, diverticulosis/diverticulitis, ischemic mesentery, inflammatory bowel disease, strangulated/incarcerated hernia, gastritis, PUD. Patient without peritoneal signs or other indication of need for surgical intervention.  ?

## 2022-03-01 ENCOUNTER — Telehealth: Payer: Self-pay

## 2022-03-01 NOTE — Telephone Encounter (Signed)
I tried to reach the patient by phone with the interpretor service and had no answer. I called her daughter Loretta Copeland and she will communicate with her mother the instructions to hold the Xarelto for 2 days before her procedure.   ? ? ?From Dr Lenell Antu- ? ?Patient no longer needs any form of anticoagulation from my standpoint. She may discontinue Xarelto at any time from a DVT perspective. It is safe for her to stop it for endoscopy. Please call for any questions. ?  ?Rande Brunt. Lenell Antu, MD ?[facility] ?Office Phone Number: [phone] ?02/08/2022 1:45 PM ?

## 2022-03-18 ENCOUNTER — Emergency Department (HOSPITAL_BASED_OUTPATIENT_CLINIC_OR_DEPARTMENT_OTHER): Payer: Medicare Other

## 2022-03-18 ENCOUNTER — Other Ambulatory Visit: Payer: Self-pay

## 2022-03-18 ENCOUNTER — Emergency Department (HOSPITAL_BASED_OUTPATIENT_CLINIC_OR_DEPARTMENT_OTHER)
Admission: EM | Admit: 2022-03-18 | Discharge: 2022-03-18 | Disposition: A | Discharge status: Home / Self Care | Payer: Medicare Other | Attending: Emergency Medicine | Admitting: Emergency Medicine

## 2022-03-18 ENCOUNTER — Encounter (HOSPITAL_BASED_OUTPATIENT_CLINIC_OR_DEPARTMENT_OTHER): Payer: Self-pay

## 2022-03-18 DIAGNOSIS — K529 Noninfective gastroenteritis and colitis, unspecified: Secondary | ICD-10-CM | POA: Insufficient documentation

## 2022-03-18 DIAGNOSIS — Z7901 Long term (current) use of anticoagulants: Secondary | ICD-10-CM | POA: Insufficient documentation

## 2022-03-18 DIAGNOSIS — K922 Gastrointestinal hemorrhage, unspecified: Secondary | ICD-10-CM

## 2022-03-18 DIAGNOSIS — R109 Unspecified abdominal pain: Secondary | ICD-10-CM | POA: Diagnosis present

## 2022-03-18 LAB — COMPREHENSIVE METABOLIC PANEL
ALT: 15 U/L (ref 0–44)
AST: 18 U/L (ref 15–41)
Albumin: 3.9 g/dL (ref 3.5–5.0)
Alkaline Phosphatase: 49 U/L (ref 38–126)
Anion gap: 10 (ref 5–15)
BUN: 10 mg/dL (ref 8–23)
CO2: 25 mmol/L (ref 22–32)
Calcium: 9.3 mg/dL (ref 8.9–10.3)
Chloride: 102 mmol/L (ref 98–111)
Creatinine, Ser: 0.87 mg/dL (ref 0.44–1.00)
GFR, Estimated: >60 mL/min (ref >60)
Glucose, Bld: 112 mg/dL — ABNORMAL HIGH (ref 70–99)
Potassium: 3.9 mmol/L (ref 3.5–5.1)
Sodium: 137 mmol/L (ref 135–145)
Total Bilirubin: 0.9 mg/dL (ref 0.3–1.2)
Total Protein: 7.6 g/dL (ref 6.5–8.1)

## 2022-03-18 LAB — URINALYSIS, ROUTINE W REFLEX MICROSCOPIC
Bilirubin Urine: NEGATIVE
Glucose, UA: NEGATIVE mg/dL
Ketones, ur: NEGATIVE mg/dL
Nitrite: NEGATIVE
Protein, ur: NEGATIVE mg/dL
Specific Gravity, Urine: 1.015 (ref 1.005–1.030)
pH: 6.5 (ref 5.0–8.0)

## 2022-03-18 LAB — CBC
HCT: 48.6 % — ABNORMAL HIGH (ref 36.0–46.0)
Hemoglobin: 16.3 g/dL — ABNORMAL HIGH (ref 12.0–15.0)
MCH: 28.5 pg (ref 26.0–34.0)
MCHC: 33.5 g/dL (ref 30.0–36.0)
MCV: 85 fL (ref 80.0–100.0)
Platelets: 290 10*3/uL (ref 150–400)
RBC: 5.72 MIL/uL — ABNORMAL HIGH (ref 3.87–5.11)
RDW: 13.6 % (ref 11.5–15.5)
WBC: 10.1 10*3/uL (ref 4.0–10.5)
nRBC: 0 % (ref 0.0–0.2)

## 2022-03-18 LAB — URINALYSIS, MICROSCOPIC (REFLEX)

## 2022-03-18 LAB — OCCULT BLOOD X 1 CARD TO LAB, STOOL: Fecal Occult Bld: POSITIVE — AB

## 2022-03-18 MED ORDER — CIPROFLOXACIN HCL 500 MG PO TABS: 500.0000 mg | ORAL_TABLET | Freq: Two times a day (BID) | ORAL | 0 refills | Status: AC

## 2022-03-18 MED ORDER — ACETAMINOPHEN 325 MG PO TABS
ORAL_TABLET | ORAL | Status: AC
  Filled 2022-03-18: qty 1

## 2022-03-18 MED ORDER — IOHEXOL 350 MG/ML SOLN
100.0000 mL | Freq: Once | INTRAVENOUS | Status: AC | PRN
  Administered 2022-03-18: 100 mL via INTRAVENOUS

## 2022-03-18 MED ORDER — METRONIDAZOLE 500 MG PO TABS: 500.0000 mg | ORAL_TABLET | Freq: Two times a day (BID) | ORAL | 0 refills | Status: DC

## 2022-03-18 MED ORDER — ACETAMINOPHEN 325 MG PO TABS
650.0000 mg | ORAL_TABLET | Freq: Once | ORAL | Status: AC
Start: 2022-03-18 — End: 2022-03-18
  Administered 2022-03-18: 650 mg via ORAL
  Filled 2022-03-18: qty 2

## 2022-03-18 NOTE — ED Triage Notes (Signed)
Pt arrives with sister who reprots patient hs not been having regular BMs r/t not eating. Over the past few days the sister reports patient has been having pressure in her abdomen with hard stools. The blood that she sees started bright and has become dark with clots. Pt reports 4-5 bloody stools over the last 24 hours.  ?

## 2022-03-18 NOTE — ED Notes (Signed)
Pt. Keeps asking to leave and wants to leave by 11pm ?

## 2022-03-18 NOTE — ED Notes (Signed)
Pt states she has already given a urine sample ?

## 2022-03-18 NOTE — ED Provider Notes (Signed)
?[facility] ?Provider Note ? ? ?CSN: [id number] ?Arrival date & time: 03/18/22  1647 ? ?  ? ?History ? ?Chief Complaint  ?Patient presents with  ? Rectal Bleeding  ? ? ?Loretta Copeland is a 66 y.o. female. ? ?Patient presents chief complaint abdominal pain off and on for the past several weeks.  However she presents today because she noticed bright red stools in her rectum today.  She had hard stools per her sister.  Denies any fevers or cough denies vomiting. ? ? ?  ? ?Home Medications ?Prior to Admission medications   ?Medication Sig Start Date End Date Taking? Authorizing Provider  ?ciprofloxacin (CIPRO) 500 MG tablet Take 1 tablet (500 mg total) by mouth every 12 (twelve) hours for 7 days. 03/18/22 03/25/22 Yes Luna Fuse, MD  ?metroNIDAZOLE (FLAGYL) 500 MG tablet Take 1 tablet (500 mg total) by mouth 2 (two) times daily. 03/18/22  Yes Luna Fuse, MD  ?albuterol (PROVENTIL HFA;VENTOLIN HFA) 108 (90 Base) MCG/ACT inhaler Inhale 2 puffs into the lungs every 4 (four) hours as needed for wheezing or shortness of breath. 12/10/18   Vanessa Kick, MD  ?cetirizine (ZYRTEC ALLERGY) 10 MG tablet Take 1 tablet (10 mg total) by mouth daily. 08/21/20   Jaynee Eagles, PA-C  ?cyclobenzaprine (FLEXERIL) 5 MG tablet Take 1 tablet (5 mg total) by mouth at bedtime. 04/13/21   Zigmund Gottron, NP  ?diclofenac sodium (VOLTAREN) 1 % GEL Apply 2 g topically 4 (four) times daily. 05/04/16   Robyn Haber, MD  ?dicyclomine (BENTYL) 20 MG tablet Take 1 tablet (20 mg total) by mouth 2 (two) times daily. 11/22/21   Carlisle Cater, PA-C  ?fluticasone (FLONASE) 50 MCG/ACT nasal spray Place 1 spray into both nostrils daily. 08/21/21   Hans Eden, NP  ?LORazepam (ATIVAN) 2 MG tablet Take 2 mg by mouth 3 (three) times daily as needed. 08/14/21   [provider]  ?PEG-KCl-NaCl-NaSulf-Na Asc-C (PLENVU) 140 g SOLR Take 140 g by mouth as directed. Manufacturer's coupon Universal coupon code:BIN: P2366821;  GROUP: [id number]; PCN: [id number]; IDDA:[id number]; PAY NO MORE $50 02/08/22   Doran Stabler, MD  ?predniSONE (DELTASONE) 20 MG tablet Take 2 tablets (40 mg total) by mouth daily. 08/21/21   Hans Eden, NP  ?XARELTO 20 MG TABS tablet TAKE 1 TABLET(20 MG) BY MOUTH DAILY WITH SUPPER 02/18/22   Waynetta Sandy, MD  ?   ? ?Allergies    ?Penicillins and Tetracyclines & related   ? ?Review of Systems   ?Review of Systems  ?Constitutional:  Negative for fever.  ?HENT:  Negative for ear pain.   ?Eyes:  Negative for pain.  ?Respiratory:  Negative for cough.   ?Cardiovascular:  Negative for chest pain.  ?Gastrointestinal:  Positive for abdominal pain.  ?Genitourinary:  Negative for flank pain.  ?Musculoskeletal:  Negative for back pain.  ?Skin:  Negative for rash.  ?Neurological:  Negative for headaches.  ? ?Physical Exam ?Updated Vital Signs ?BP 132/66 (BP Location: Right Arm)   Pulse 86   Temp 99.9 ?F (37.7 ?C)   Resp (!) 22   Ht 5\' 4"  (1.626 m)   Wt 60.6 kg   SpO2 97%   BMI 22.93 kg/m?  ?Physical Exam ?Constitutional:   ?   General: She is not in acute distress. ?   Appearance: Normal appearance.  ?HENT:  ?   Head: Normocephalic.  ?   Nose: Nose normal.  ?Eyes:  ?  Extraocular Movements: Extraocular movements intact.  ?Cardiovascular:  ?   Rate and Rhythm: Normal rate.  ?Pulmonary:  ?   Effort: Pulmonary effort is normal.  ?Abdominal:  ?   Tenderness: There is abdominal tenderness.  ?   Comments: Lower abdominal tenderness on exam.  No guarding or rebound.  ?Genitourinary: ?   Comments: Rectal exam shows faint pink blood.  Guaiac positive.  No stool ball noted in the rectal vault. ?Musculoskeletal:     ?   General: Normal range of motion.  ?   Cervical back: Normal range of motion.  ?Neurological:  ?   General: No focal deficit present.  ?   Mental Status: She is alert. Mental status is at baseline.  ? ? ?ED Results / Procedures / Treatments   ?Labs ?(all labs ordered are listed, but only abnormal  results are displayed) ?Labs Reviewed  ?COMPREHENSIVE METABOLIC PANEL - Abnormal; Notable for the following components:  ?    Result Value  ? Glucose, Bld 112 (*)   ? All other components within normal limits  ?CBC - Abnormal; Notable for the following components:  ? RBC 5.72 (*)   ? Hemoglobin 16.3 (*)   ? HCT 48.6 (*)   ? All other components within normal limits  ?URINALYSIS, ROUTINE W REFLEX MICROSCOPIC - Abnormal; Notable for the following components:  ? Hgb urine dipstick MODERATE (*)   ? Leukocytes,Ua TRACE (*)   ? All other components within normal limits  ?OCCULT BLOOD X 1 CARD TO LAB, STOOL - Abnormal; Notable for the following components:  ? Fecal Occult Bld POSITIVE (*)   ? All other components within normal limits  ?URINALYSIS, MICROSCOPIC (REFLEX) - Abnormal; Notable for the following components:  ? Bacteria, UA RARE (*)   ? All other components within normal limits  ? ? ?EKG ?None ? ?Radiology ?DG Chest Port 1 View ? ?Result Date: 03/18/2022 ?CLINICAL DATA:  Fever EXAM: PORTABLE CHEST 1 VIEW COMPARISON:  02/03/2022 FINDINGS: Biapical pleuroparenchymal scarring. No acute confluent opacities or effusions. Normal heart size. No acute bony abnormality. IMPRESSION: No active disease. Electronically Signed   By: Rolm Baptise M.D.   On: 03/18/2022 20:03  ? ?CT Angio Abd/Pel W and/or Wo Contrast ? ?Result Date: 03/18/2022 ?CLINICAL DATA:  Lower GI bleed with abdominal discomfort and pressure. Rectal bleeding and bloody stools. EXAM: CTA ABDOMEN AND PELVIS WITHOUT AND WITH CONTRAST TECHNIQUE: Multidetector CT imaging of the abdomen and pelvis was performed using the standard protocol during bolus administration of intravenous contrast. Multiplanar reconstructed images and MIPs were obtained and reviewed to evaluate the vascular anatomy. RADIATION DOSE REDUCTION: This exam was performed according to the departmental dose-optimization program which includes automated exposure control, adjustment of the mA and/or kV  according to patient size and/or use of iterative reconstruction technique. CONTRAST:  124mL OMNIPAQUE IOHEXOL 350 MG/ML SOLN COMPARISON:  CT with IV contrast 08/09/2021. FINDINGS: VASCULAR Aorta: There is mild abdominal aortic mixed plaque. Some of the soft plaque again demonstrates early ulcerative features but there is no dissection, aneurysm or penetrating ulcer , no flow-limiting stenosis. Celiac: Patent without evidence of aneurysm, dissection, vasculitis or significant stenosis. SMA: Patent without evidence of aneurysm, dissection, vasculitis or significant stenosis. Renals: Both single renal arteries are patent without evidence of aneurysm, dissection, vasculitis, fibromuscular dysplasia or significant stenosis. IMA: There is a high-grade soft plaque stenosis of the vessel origin. Otherwise the vessel opacify normally. Inflow: Patent without evidence of aneurysm, dissection, vasculitis or significant stenosis. Proximal  Outflow: Bilateral common femoral and visualized portions of the superficial and profunda femoral arteries are patent without evidence of aneurysm, dissection, vasculitis or significant stenosis. Veins: In the venous phase, again noted is a thrombotic filling defect partially filling the left common femoral vein, but it is smaller than previously and does not extend into the proximal superficial femoral vein. No other venous filling defect is seen. The hepatic portal vein is normal caliber. Review of the MIP images confirms the above findings. NON-VASCULAR Lower chest: No acute abnormality. Hepatobiliary: 19 cm in length mildly steatotic liver without mass enhancement. There are small stones in the gallbladder but no wall thickening or biliary dilatation. Pancreas: No focal abnormality. Spleen: No focal abnormality.  No splenomegaly. Adrenals/Urinary Tract: 1 cm cyst lower pole right kidney. Otherwise unremarkable renal cortex. No stones or hydronephrosis are seen no bladder thickening. There  is no adrenal mass. Stomach/Bowel: The gastric wall is contracted. No focal abnormality is seen the unopacified small bowel. The appendix is normal. There are colonic diverticula in the sigmoid segment which are unc

## 2022-03-18 NOTE — ED Notes (Signed)
Patient transported to CT 

## 2022-03-18 NOTE — Discharge Instructions (Addendum)
Follow-up with your gastroenterologist.  Call them tomorrow to schedule an appointment. ? ?Return to the ER if you have worsening symptoms heavier bleeding or any additional concerns. ?

## 2022-04-02 ENCOUNTER — Encounter: Payer: Medicare Other | Admitting: Gastroenterology

## 2022-10-29 ENCOUNTER — Ambulatory Visit (HOSPITAL_COMMUNITY)
Admission: EM | Admit: 2022-10-29 | Discharge: 2022-10-29 | Disposition: A | Discharge status: Home / Self Care | Payer: Medicare Other | Attending: Emergency Medicine | Admitting: Emergency Medicine

## 2022-10-29 ENCOUNTER — Telehealth (HOSPITAL_COMMUNITY): Payer: Self-pay | Admitting: Emergency Medicine

## 2022-10-29 DIAGNOSIS — R4189 Other symptoms and signs involving cognitive functions and awareness: Secondary | ICD-10-CM | POA: Diagnosis not present

## 2022-10-29 DIAGNOSIS — R051 Acute cough: Secondary | ICD-10-CM | POA: Diagnosis not present

## 2022-10-29 DIAGNOSIS — H60502 Unspecified acute noninfective otitis externa, left ear: Secondary | ICD-10-CM

## 2022-10-29 MED ORDER — BENZONATATE 100 MG PO CAPS: 100.0000 mg | ORAL_CAPSULE | Freq: Three times a day (TID) | ORAL | 0 refills | Status: DC

## 2022-10-29 MED ORDER — BENZONATATE 100 MG PO CAPS: 100.0000 mg | ORAL_CAPSULE | Freq: Three times a day (TID) | ORAL | 0 refills | Status: AC

## 2022-10-29 MED ORDER — CIPROFLOXACIN-DEXAMETHASONE 0.3-0.1 % OT SUSP: 4.0000 [drp] | Freq: Two times a day (BID) | OTIC | 0 refills | Status: DC

## 2022-10-29 MED ORDER — CIPROFLOXACIN-DEXAMETHASONE 0.3-0.1 % OT SUSP: 4.0000 [drp] | Freq: Two times a day (BID) | OTIC | 0 refills | Status: AC

## 2022-10-29 NOTE — ED Provider Notes (Signed)
[facility]    CSN: [id number] Arrival date & time: 10/29/22  1100      History   Chief Complaint Chief Complaint  Patient presents with   Otalgia   Headache    HPI Orlanda Frankum is a 66 y.o. female.  Attempted to use medical interpretor for this encounter Difficult encounter given patient would interrupt translator and provider and speak without listening to previous discussion. Patient was requesting ciprofloxacin antibiotic for her headache Although we tried to discuss this, patient would not accept that antibiotics are not used for headaches.  Poor historian, although it seems 3 days of headache with left ear pain. She does state this has been going on for a long time, has happened a lot in the past. She has a piece of paper with the name ciprofloxacin written on it, and continuously requests that medication. Maybe has had a cough on and off for the last few days and wants tessalon refilled. She has empty container from a past visit.  On chart review, she was given that abx in the ED for a bout of colitis. She did not/would not elaborate further on her symptoms, as she kept asking for the cipro abx saying she needs it for pain.  Past Medical History:  Diagnosis Date   Anxiety    Arrhythmia    Arthritis    Asthma    Depression    DVT (deep venous thrombosis) (HCC)    Hearing difficulty    Migraines    Thrombophlebitis 11/2016   Thyroid disease    Vision changes     Patient Active Problem List   Diagnosis Date Noted   Thrombophlebitis 11/15/2016   Sinusitis, chronic 09/07/2016   Smoking 11/06/2015    No past surgical history on file.  OB History   No obstetric history on file.      Home Medications    Prior to Admission medications   Medication Sig Start Date End Date Taking? Authorizing Provider  albuterol (PROVENTIL HFA;VENTOLIN HFA) 108 (90 Base) MCG/ACT inhaler Inhale 2 puffs into the lungs every 4 (four) hours as needed for wheezing  or shortness of breath. 12/10/18   Mardella Layman, MD  benzonatate (TESSALON) 100 MG capsule Take 1 capsule (100 mg total) by mouth every 8 (eight) hours for 3 days. 10/29/22 11/01/22  Desi Rowe, Lurena Joiner, PA-C  cetirizine (ZYRTEC ALLERGY) 10 MG tablet Take 1 tablet (10 mg total) by mouth daily. 08/21/20   Wallis Bamberg, PA-C  ciprofloxacin-dexamethasone (CIPRODEX) OTIC suspension Place 4 drops into the left ear 2 (two) times daily for 5 days. 10/29/22 11/03/22  Lynnix Schoneman, Lurena Joiner, PA-C  cyclobenzaprine (FLEXERIL) 5 MG tablet Take 1 tablet (5 mg total) by mouth at bedtime. 04/13/21   Linus Mako B, NP  diclofenac sodium (VOLTAREN) 1 % GEL Apply 2 g topically 4 (four) times daily. 05/04/16   [name], MD  dicyclomine (BENTYL) 20 MG tablet Take 1 tablet (20 mg total) by mouth 2 (two) times daily. 11/22/21   Renne Crigler, PA-C  fluticasone (FLONASE) 50 MCG/ACT nasal spray Place 1 spray into both nostrils daily. 08/21/21   White, Elita Boone, NP  LORazepam (ATIVAN) 2 MG tablet Take 2 mg by mouth 3 (three) times daily as needed. 08/14/21   [provider]  PEG-KCl-NaCl-NaSulf-Na Asc-C (PLENVU) 140 g SOLR Take 140 g by mouth as directed. Manufacturer's coupon Universal coupon code:BIN: [id number]; GROUP: [id number]; PCN: [id number]; ID: [id number]; PAY NO MORE $50 02/08/22   Sherrilyn Rist, MD  XARELTO 20 MG TABS tablet TAKE 1 TABLET(20 MG) BY MOUTH DAILY WITH SUPPER 02/18/22   Maeola Harman, MD    Family History Family History  Problem Relation Age of Onset   Diabetes Mother    Diabetes Father    Diabetes Brother    Diabetes Daughter     Social History Social History   Tobacco Use   Smoking status: Every Day    Packs/day: 1.00    Years: 30.00    Total pack years: 30.00    Types: Cigarettes   Smokeless tobacco: Never   Tobacco comments:    not smoking every day  Vaping Use   Vaping Use: Never used  Substance Use Topics   Alcohol use: No    Alcohol/week: 0.0 standard drinks of  alcohol   Drug use: No     Allergies   Penicillins and Tetracyclines & related   Review of Systems Review of Systems  HENT:  Positive for ear pain.   Neurological:  Positive for headaches.  As Per HPI although very difficult to obtain  Physical Exam Triage Vital Signs ED Triage Vitals  Enc Vitals Group     BP 10/29/22 1241 (!) 154/88     Pulse Rate 10/29/22 1241 (!) 116     Resp 10/29/22 1241 18     Temp 10/29/22 1241 99.2 F (37.3 C)     Temp Source 10/29/22 1241 Oral     SpO2 10/29/22 1241 94 %     Weight 10/29/22 1247 135 lb 12.8 oz (61.6 kg)     Height --      Head Circumference --      Peak Flow --      Pain Score 10/29/22 1246 10     Pain Loc --      Pain Edu? --      Excl. in GC? --    No data found.  Updated Vital Signs BP (!) 154/88 (BP Location: Left Arm)   Pulse (!) 116   Temp 99.2 F (37.3 C) (Oral)   Resp 18   Wt 135 lb 12.8 oz (61.6 kg)   SpO2 94%   BMI 23.31 kg/m    Physical Exam Constitutional:      General: She is not in acute distress.    Appearance: She is not ill-appearing or toxic-appearing.  HENT:     Right Ear: Tympanic membrane and ear canal normal.     Ears:     Comments: Left ear otitis externa. Right is normal     Mouth/Throat:     Mouth: Mucous membranes are moist.     Pharynx: Oropharynx is clear.     Comments: Speaks clearly  Cardiovascular:     Rate and Rhythm: Normal rate and regular rhythm.     Pulses: Normal pulses.  Pulmonary:     Effort: Pulmonary effort is normal.  Musculoskeletal:        General: Normal range of motion.     Cervical back: Normal range of motion.  Skin:    General: Skin is warm and dry.  Neurological:     Mental Status: She is alert.     Motor: No weakness.     Gait: Gait normal.     Comments: Difficult to assess mental status. Unknown if this is her baseline. She was alert, standing, and moving extremities throughout visit.     UC Treatments / Results  Labs (all labs ordered are  listed, but only abnormal results  are displayed) Labs Reviewed - No data to display  EKG  Radiology No results found.  Procedures Procedures  Medications Ordered in UC Medications - No data to display  Initial Impression / Assessment and Plan / UC Course  I have reviewed the triage vital signs and the nursing notes.  Pertinent labs & imaging results that were available during my care of the patient were reviewed by me and considered in my medical decision making (see chart for details).  Again difficult encounter and history/exam are limited by language barrier despite translator access throughout. Many times tried to get more information from patient regarding symptoms, many times tried to explain ciprofloxacin antibiotic is not used for infection. I tried to discuss the cipro was used for a GI infection at her ED visit, which wasn't related to headache. Tried to discuss use of ear drops for otitis externa, went over instructions but was repeatedly interrupted by patient asking for oral antibiotics. Patient did not seem to accept oral antibiotics are not warranted at this time, and I recommend to follow up with primary care provider for further discussion if needed. For now I have recommended Cipro-Dex drops for the ear, twice daily for 5 days. I sent a few pills of tessalon for her cough.   E/M level 4: 30 minutes spent with patient, patient education, possible [facility] as barrier to care  Final Clinical Impressions(s) / UC Diagnoses   Final diagnoses:  Acute otitis externa of left ear, unspecified type  Disturbance of understanding  Acute cough     Discharge Instructions      Take the cough medicine every 6 hours as needed  Please use the ear drops in the left ear as prescribed.     ED Prescriptions     Medication Sig Dispense Auth. Provider   benzonatate (TESSALON) 100 MG capsule Take 1 capsule (100 mg total) by mouth every 8 (eight) hours for 3 days. 9 capsule  Elier Zellars, PA-C   ciprofloxacin-dexamethasone (CIPRODEX) OTIC suspension Place 4 drops into the left ear 2 (two) times daily for 5 days. 7.5 mL Hosteen Kienast, Lurena Joiner, PA-C      PDMP not reviewed this encounter.   Marlow Baars, [place] 10/29/22 1424

## 2022-10-29 NOTE — Discharge Instructions (Addendum)
Take the cough medicine every 6 hours as needed  Please use the ear drops in the left ear as prescribed.

## 2022-10-29 NOTE — ED Triage Notes (Signed)
Per interpreter, pt c/o lt ear pain, headaches, and veins in both lower legs. States the only thing that helps in Cipro and needs a refill. States going on from a long time.

## 2022-11-04 ENCOUNTER — Ambulatory Visit: Payer: Medicare Other | Admitting: Family Medicine

## 2022-12-05 ENCOUNTER — Ambulatory Visit (HOSPITAL_COMMUNITY)
Admission: EM | Admit: 2022-12-05 | Discharge: 2022-12-05 | Disposition: A | Discharge status: Home / Self Care | Payer: Medicare Other

## 2023-03-09 ENCOUNTER — Encounter (HOSPITAL_COMMUNITY): Payer: Self-pay

## 2023-03-09 ENCOUNTER — Ambulatory Visit (HOSPITAL_COMMUNITY)
Admission: EM | Admit: 2023-03-09 | Discharge: 2023-03-09 | Disposition: A | Discharge status: Home / Self Care | Payer: Medicare Other | Attending: Physician Assistant | Admitting: Physician Assistant

## 2023-03-09 DIAGNOSIS — K529 Noninfective gastroenteritis and colitis, unspecified: Secondary | ICD-10-CM | POA: Diagnosis not present

## 2023-03-09 DIAGNOSIS — Z760 Encounter for issue of repeat prescription: Secondary | ICD-10-CM

## 2023-03-09 DIAGNOSIS — G8929 Other chronic pain: Secondary | ICD-10-CM

## 2023-03-09 MED ORDER — PREDNISONE 10 MG PO TABS: 10.0000 mg | ORAL_TABLET | Freq: Three times a day (TID) | ORAL | 0 refills | Status: AC

## 2023-03-09 MED ORDER — METRONIDAZOLE 500 MG PO TABS: 500.0000 mg | ORAL_TABLET | Freq: Two times a day (BID) | ORAL | 0 refills | Status: AC

## 2023-03-09 MED ORDER — KETOROLAC TROMETHAMINE 30 MG/ML IJ SOLN
INTRAMUSCULAR | Status: AC
  Filled 2023-03-09: qty 1

## 2023-03-09 MED ORDER — CYCLOBENZAPRINE HCL 5 MG PO TABS: 5.0000 mg | ORAL_TABLET | Freq: Every day | ORAL | 0 refills | Status: AC

## 2023-03-09 MED ORDER — KETOROLAC TROMETHAMINE 30 MG/ML IJ SOLN
30.0000 mg | Freq: Once | INTRAMUSCULAR | Status: AC
  Administered 2023-03-09: 30 mg via INTRAMUSCULAR

## 2023-03-09 MED ORDER — CIPROFLOXACIN HCL 500 MG PO TABS: 500.0000 mg | ORAL_TABLET | Freq: Two times a day (BID) | ORAL | 0 refills | Status: AC

## 2023-03-09 MED ORDER — RIVAROXABAN 20 MG PO TABS: 20.0000 mg | ORAL_TABLET | Freq: Every day | ORAL | 2 refills | Status: DC

## 2023-03-09 NOTE — Discharge Instructions (Signed)
Advised to take the medications as directed and advised to follow-up with your family medicine specialist for further prescription renewals if needed.  Return to urgent care as needed.

## 2023-03-09 NOTE — ED Notes (Signed)
Writer called a second time for appointment, but no answer

## 2023-03-09 NOTE — ED Triage Notes (Signed)
Patient reports a history of DVT and   States when she went to get refilled was told that they would not be able to get the medicine until May sometime. Patient is currently c/o bilateral leg pain and the R>L. Patient thinks she has been out of Xarelto x 20 days. Patient denies any SOB.   Patient is HOH and has trouble hearing the [employer].

## 2023-03-09 NOTE — ED Notes (Signed)
Writer called number provided and no answer. Mailbox was full and unable to leave a message.

## 2023-03-09 NOTE — ED Provider Notes (Signed)
[place]    CSN: [id number] Arrival date & time: 03/09/23  1414      History   Chief Complaint Chief Complaint  Patient presents with   Medication Refill   Leg Pain    HPI Loretta Copeland is a 67 y.o. female.   67 year old female presents with need for prescription renewal for her colitis, on continuous Xarelto, diffuse pain.  History is being taken through interpreter services with coloration being the language.  To be able to take a history is very difficult in this patient, there is definitely a language barrier with difficulty understanding what the patient desires and what the medical problems are.  She does not she needs a refill on her Xarelto that she has been taking for many years for blood clot.  She indicates she has been doing well on this medication without any problems or side effects.  She also indicates that she has a history of having colitis which believed to interpret as having diverticulitis which occurs intermittently.  She indicates she has been having some left lower quadrant pain and discomfort over the past several days.  She indicates that when she takes Cipro and Flagyl together that this helps relieve her discomfort.  She indicates that the pain has been present for the past several days without fever, chills, nausea or vomiting.  Patient also indicates that she has significant joint pain and arm pain that she occasionally takes medication for she desires to have a refill of her muscle relaxer and request some low-dose prednisone for couple days to help relieve her acute joint pain and discomfort.  Patient indicates that she would like to have an injection for the pain today to help relieve her discomfort.  She does have a PCP that she follows up on a regular basis but she just does not have an appointment for several weeks.   Medication Refill Leg Pain   Past Medical History:  Diagnosis Date   Anxiety    Arrhythmia    Arthritis    Asthma     Depression    DVT (deep venous thrombosis) (HCC)    Hearing difficulty    Migraines    Thrombophlebitis 11/2016   Thyroid disease    Vision changes     Patient Active Problem List   Diagnosis Date Noted   Thrombophlebitis 11/15/2016   Sinusitis, chronic 09/07/2016   Smoking 11/06/2015    History reviewed. No pertinent surgical history.  OB History   No obstetric history on file.      Home Medications    Prior to Admission medications   Medication Sig Start Date End Date Taking? Authorizing Provider  ciprofloxacin (CIPRO) 500 MG tablet Take 1 tablet (500 mg total) by mouth 2 (two) times daily. 03/09/23  Yes Nyoka Lint, PA-C  metroNIDAZOLE (FLAGYL) 500 MG tablet Take 1 tablet (500 mg total) by mouth 2 (two) times daily. 03/09/23  Yes Nyoka Lint, PA-C  predniSONE (DELTASONE) 10 MG tablet Take 1 tablet (10 mg total) by mouth in the morning, at noon, and at bedtime. 03/09/23  Yes Nyoka Lint, PA-C  rivaroxaban (XARELTO) 20 MG TABS tablet Take 1 tablet (20 mg total) by mouth daily with supper. 03/09/23  Yes Nyoka Lint, PA-C  albuterol (PROVENTIL HFA;VENTOLIN HFA) 108 (90 Base) MCG/ACT inhaler Inhale 2 puffs into the lungs every 4 (four) hours as needed for wheezing or shortness of breath. 12/10/18   Vanessa Kick, MD  cetirizine (ZYRTEC ALLERGY) 10 MG tablet Take 1  tablet (10 mg total) by mouth daily. 08/21/20   Jaynee Eagles, PA-C  cyclobenzaprine (FLEXERIL) 5 MG tablet Take 1 tablet (5 mg total) by mouth at bedtime. 03/09/23   Nyoka Lint, PA-C  diclofenac sodium (VOLTAREN) 1 % GEL Apply 2 g topically 4 (four) times daily. 05/04/16   Robyn Haber, MD  dicyclomine (BENTYL) 20 MG tablet Take 1 tablet (20 mg total) by mouth 2 (two) times daily. 11/22/21   Carlisle Cater, PA-C  fluticasone (FLONASE) 50 MCG/ACT nasal spray Place 1 spray into both nostrils daily. 08/21/21   White, Leitha Schuller, NP  LORazepam (ATIVAN) 2 MG tablet Take 2 mg by mouth 3 (three) times daily as needed. 08/14/21    [provider]  PEG-KCl-NaCl-NaSulf-Na Asc-C (PLENVU) 140 g SOLR Take 140 g by mouth as directed. Manufacturer's coupon Universal coupon code:BIN: C1589615; GROUP: [id number]; PCN: [id number]; IDCY:[id number]; PAY NO MORE $50 02/08/22   Doran Stabler, MD  XARELTO 20 MG TABS tablet TAKE 1 TABLET(20 MG) BY MOUTH DAILY WITH SUPPER 02/18/22   Waynetta Sandy, MD    Family History Family History  Problem Relation Age of Onset   Diabetes Mother    Diabetes Father    Diabetes Brother    Diabetes Daughter     Social History Social History   Tobacco Use   Smoking status: Every Day    Packs/day: 1.00    Years: 30.00    Additional pack years: 0.00    Total pack years: 30.00    Types: Cigarettes   Smokeless tobacco: Never   Tobacco comments:    not smoking every day  Vaping Use   Vaping Use: Never used  Substance Use Topics   Alcohol use: No    Alcohol/week: 0.0 standard drinks of alcohol   Drug use: No     Allergies   Penicillins and Tetracyclines & related   Review of Systems Review of Systems  Gastrointestinal:  Positive for abdominal pain (left lower quadrant).     Physical Exam Triage Vital Signs ED Triage Vitals [03/09/23 1610]  Enc Vitals Group     BP 135/71     Pulse Rate 90     Resp 18     Temp 98.1 F (36.7 C)     Temp Source Oral     SpO2 96 %     Weight      Height      Head Circumference      Peak Flow      Pain Score      Pain Loc      Pain Edu?      Excl. in [place]?    No data found.  Updated Vital Signs BP 135/71 (BP Location: Left Arm)   Pulse 90   Temp 98.1 F (36.7 C) (Oral)   Resp 18   SpO2 96%   Visual Acuity Right Eye Distance:   Left Eye Distance:   Bilateral Distance:    Right Eye Near:   Left Eye Near:    Bilateral Near:     Physical Exam Constitutional:      Appearance: Normal appearance.  Cardiovascular:     Rate and Rhythm: Normal rate and regular rhythm.     Heart sounds: Normal heart sounds.   Pulmonary:     Effort: Pulmonary effort is normal.     Breath sounds: Normal breath sounds and air entry. No wheezing, rhonchi or rales.  Abdominal:     General: Abdomen is  flat. Bowel sounds are normal.     Palpations: Abdomen is soft.     Tenderness: There is abdominal tenderness in the left lower quadrant. There is no guarding or rebound.  Skin:    Comments: Bilateral lower legs: There is no evidence of redness or swelling bilateral, negative Homans' sign bilaterally.  Neurological:     Mental Status: She is alert.      UC Treatments / Results  Labs (all labs ordered are listed, but only abnormal results are displayed) Labs Reviewed - No data to display  EKG   Radiology No results found.  Procedures Procedures (including critical care time)  Medications Ordered in UC Medications  ketorolac (TORADOL) 30 MG/ML injection 30 mg (has no administration in time range)    Initial Impression / Assessment and Plan / UC Course  I have reviewed the triage vital signs and the nursing notes.  Pertinent labs & imaging results that were available during my care of the patient were reviewed by me and considered in my medical decision making (see chart for details).    Plan: The diagnosis to be treated with the following: 1.  Colitis: A.  Cipro 500 mg, 1 tablet twice a day for 5 days only. B.  Flagyl 500 mg, 1 tablet twice a day for 5 days only. 2.  Chronic pain: A.  Flagyl 5 mg at bedtime to help reduce pain: B.  Prednisone 10 mg every 8 hours for 5 days only to help reduce acute pain. 3.  Medication refill: A.  Xarelto 20 mg once daily. 4.  Patient advised to follow-up with PCP return to urgent care as needed.  Final Clinical Impressions(s) / UC Diagnoses   Final diagnoses:  Medication refill  Other chronic pain  Colitis     Discharge Instructions      Advised to take the medications as directed and advised to follow-up with your family medicine specialist for  further prescription renewals if needed.  Return to urgent care as needed.    ED Prescriptions     Medication Sig Dispense Auth. Provider   rivaroxaban (XARELTO) 20 MG TABS tablet Take 1 tablet (20 mg total) by mouth daily with supper. 30 tablet Nyoka Lint, PA-C   ciprofloxacin (CIPRO) 500 MG tablet Take 1 tablet (500 mg total) by mouth 2 (two) times daily. 10 tablet Nyoka Lint, PA-C   metroNIDAZOLE (FLAGYL) 500 MG tablet Take 1 tablet (500 mg total) by mouth 2 (two) times daily. 10 tablet Nyoka Lint, PA-C   predniSONE (DELTASONE) 10 MG tablet Take 1 tablet (10 mg total) by mouth in the morning, at noon, and at bedtime. 15 tablet Nyoka Lint, PA-C   cyclobenzaprine (FLEXERIL) 5 MG tablet Take 1 tablet (5 mg total) by mouth at bedtime. 15 tablet Nyoka Lint, PA-C      PDMP not reviewed this encounter.   Nyoka Lint, PA-C 03/09/23 [phone]

## 2023-03-14 ENCOUNTER — Ambulatory Visit (HOSPITAL_COMMUNITY)
Admission: EM | Admit: 2023-03-14 | Discharge: 2023-03-14 | Disposition: A | Discharge status: Home / Self Care | Payer: Medicare Other | Attending: Nurse Practitioner | Admitting: Nurse Practitioner

## 2023-03-14 ENCOUNTER — Encounter (HOSPITAL_COMMUNITY): Payer: Self-pay

## 2023-03-14 DIAGNOSIS — L539 Erythematous condition, unspecified: Secondary | ICD-10-CM

## 2023-03-14 DIAGNOSIS — Z86718 Personal history of other venous thrombosis and embolism: Secondary | ICD-10-CM | POA: Diagnosis not present

## 2023-03-14 DIAGNOSIS — Z760 Encounter for issue of repeat prescription: Secondary | ICD-10-CM | POA: Diagnosis not present

## 2023-03-14 MED ORDER — METRONIDAZOLE 0.75 % EX CREA: TOPICAL_CREAM | Freq: Two times a day (BID) | CUTANEOUS | 0 refills | Status: AC

## 2023-03-14 MED ORDER — RIVAROXABAN 20 MG PO TABS: 20.0000 mg | ORAL_TABLET | Freq: Every day | ORAL | 0 refills | Status: AC

## 2023-03-14 NOTE — ED Triage Notes (Addendum)
Patient states she is having eye redness and sinus problems onset early this week. History of sinus infections. Hard of hearing.

## 2023-03-14 NOTE — Discharge Instructions (Signed)
Please clean your skin twice daily with mild soap and water; after patting dry, start using the metronidazole cream on your face twice daily (apply to clean skin)  Resume Xarelto  Please follow up with a PCP to address your other health concerns.  Go to the ER if symptoms worsen.

## 2023-03-14 NOTE — ED Provider Notes (Signed)
[place]    CSN: [id number] Arrival date & time: 03/14/23  1300      History   Chief Complaint Chief Complaint  Patient presents with   Sinus Problem    HPI Lexxie Stoecker is a 67 y.o. female.   Patient presents today for bilateral cheek redness and burning that has been ongoing for the past few days.  She denies fever, body aches, chills, congested cough, shortness of breath or chest pain, runny or stuffy nose.  Reports she has a history of asthma.  No eye redness, eye drainage.  She is concerned about a sinus infection and wants a cream and antibiotics.  Patient is also requesting a refill of Xarelto.  Reports she was seen on Sunday and was prescribed for her, however accosted a large amount of money.  She is requesting that Xarelto be sent to the [facility] instead.  Reports she has history of blood clots.  Medical interpreter was used for the visit today.     Past Medical History:  Diagnosis Date   Anxiety    Arrhythmia    Arthritis    Asthma    Depression    DVT (deep venous thrombosis) (HCC)    Hearing difficulty    Migraines    Thrombophlebitis 11/2016   Thyroid disease    Vision changes     Patient Active Problem List   Diagnosis Date Noted   Thrombophlebitis 11/15/2016   Sinusitis, chronic 09/07/2016   Smoking 11/06/2015    History reviewed. No pertinent surgical history.  OB History   No obstetric history on file.      Home Medications    Prior to Admission medications   Medication Sig Start Date End Date Taking? Authorizing Provider  metroNIDAZOLE (METROCREAM) 0.75 % cream Apply topically 2 (two) times daily. Apply thin film twice daily to face on clean skin 03/14/23  Yes Eulogio Bear, NP  albuterol (PROVENTIL HFA;VENTOLIN HFA) 108 (90 Base) MCG/ACT inhaler Inhale 2 puffs into the lungs every 4 (four) hours as needed for wheezing or shortness of breath. 12/10/18   Vanessa Kick, MD  cetirizine (ZYRTEC ALLERGY) 10 MG tablet  Take 1 tablet (10 mg total) by mouth daily. 08/21/20   Jaynee Eagles, PA-C  ciprofloxacin (CIPRO) 500 MG tablet Take 1 tablet (500 mg total) by mouth 2 (two) times daily. 03/09/23   Nyoka Lint, PA-C  cyclobenzaprine (FLEXERIL) 5 MG tablet Take 1 tablet (5 mg total) by mouth at bedtime. 03/09/23   Nyoka Lint, PA-C  diclofenac sodium (VOLTAREN) 1 % GEL Apply 2 g topically 4 (four) times daily. 05/04/16   Robyn Haber, MD  dicyclomine (BENTYL) 20 MG tablet Take 1 tablet (20 mg total) by mouth 2 (two) times daily. 11/22/21   Carlisle Cater, PA-C  fluticasone (FLONASE) 50 MCG/ACT nasal spray Place 1 spray into both nostrils daily. 08/21/21   White, Leitha Schuller, NP  LORazepam (ATIVAN) 2 MG tablet Take 2 mg by mouth 3 (three) times daily as needed. 08/14/21   [provider]  metroNIDAZOLE (FLAGYL) 500 MG tablet Take 1 tablet (500 mg total) by mouth 2 (two) times daily. 03/09/23   Nyoka Lint, PA-C  PEG-KCl-NaCl-NaSulf-Na Asc-C (PLENVU) 140 g SOLR Take 140 g by mouth as directed. Manufacturer's coupon Universal coupon code:BIN: C1589615; GROUP: [id number]; PCN: [id number]; IDCY:[id number]; PAY NO MORE $50 02/08/22   Doran Stabler, MD  predniSONE (DELTASONE) 10 MG tablet Take 1 tablet (10 mg total) by mouth in the morning,  at noon, and at bedtime. 03/09/23   Nyoka Lint, PA-C  rivaroxaban (XARELTO) 20 MG TABS tablet Take 1 tablet (20 mg total) by mouth daily with supper. 03/14/23   Eulogio Bear, NP    Family History Family History  Problem Relation Age of Onset   Diabetes Mother    Diabetes Father    Diabetes Brother    Diabetes Daughter     Social History Social History   Tobacco Use   Smoking status: Every Day    Packs/day: 1.00    Years: 30.00    Additional pack years: 0.00    Total pack years: 30.00    Types: Cigarettes   Smokeless tobacco: Never   Tobacco comments:    not smoking every day  Vaping Use   Vaping Use: Never used  Substance Use Topics   Alcohol use: No     Alcohol/week: 0.0 standard drinks of alcohol   Drug use: No     Allergies   Penicillins and Tetracyclines & related   Review of Systems Review of Systems Per HPI  Physical Exam Triage Vital Signs ED Triage Vitals  Enc Vitals Group     BP 03/14/23 1440 110/73     Pulse Rate 03/14/23 1440 82     Resp 03/14/23 1440 18     Temp 03/14/23 1440 98 F (36.7 C)     Temp Source 03/14/23 1440 Oral     SpO2 03/14/23 1440 96 %     Weight --      Height --      Head Circumference --      Peak Flow --      Pain Score 03/14/23 1432 6     Pain Loc --      Pain Edu? --      Excl. in [place]? --    No data found.  Updated Vital Signs BP 110/73 (BP Location: Right Arm)   Pulse 82   Temp 98 F (36.7 C) (Oral)   Resp 18   SpO2 96%   Visual Acuity Right Eye Distance:   Left Eye Distance:   Bilateral Distance:    Right Eye Near:   Left Eye Near:    Bilateral Near:     Physical Exam Vitals and nursing note reviewed.  Constitutional:      General: She is not in acute distress.    Appearance: Normal appearance. She is not ill-appearing or toxic-appearing.  HENT:     Head: Normocephalic and atraumatic.      Comments: Nonblanchable, erythema to bilateral cheeks in area marked; no papules, pustules, or raised areas.    Nose: Nose normal. No congestion or rhinorrhea.     Mouth/Throat:     Mouth: Mucous membranes are moist.     Pharynx: Oropharynx is clear.  Eyes:     General: No scleral icterus.    Extraocular Movements: Extraocular movements intact.  Cardiovascular:     Rate and Rhythm: Normal rate.  Pulmonary:     Effort: Pulmonary effort is normal. No respiratory distress.     Breath sounds: Normal breath sounds. No wheezing, rhonchi or rales.  Musculoskeletal:     Cervical back: Normal range of motion.  Lymphadenopathy:     Cervical: No cervical adenopathy.  Skin:    General: Skin is warm and dry.     Capillary Refill: Capillary refill takes less than 2 seconds.      Coloration: Skin is not jaundiced or pale.  Findings: Erythema present. No rash.  Neurological:     Mental Status: She is alert and oriented to person, place, and time.  Psychiatric:        Behavior: Behavior is cooperative.      UC Treatments / Results  Labs (all labs ordered are listed, but only abnormal results are displayed) Labs Reviewed - No data to display  EKG   Radiology No results found.  Procedures Procedures (including critical care time)  Medications Ordered in UC Medications - No data to display  Initial Impression / Assessment and Plan / UC Course  I have reviewed the triage vital signs and the nursing notes.  Pertinent labs & imaging results that were available during my care of the patient were reviewed by me and considered in my medical decision making (see chart for details).   Patient is well-appearing, normotensive, afebrile, not tachycardic, not tachypneic, oxygenating well on room air.    1. Erythema of face Suspect rosacea Treat with metronidazole gel-prescription sent to pharmacy Recommended follow-up with dermatology if no improvement or worsening symptoms despite treatment Also stressed importance of establishing care with primary care provider with  2. History of DVT (deep vein thrombosis) 3. Medication refill Refill sent for Xarelto to pharmacy Recommended follow-up with primary care provider  During the visit, it was difficult to follow the patient's thought process.  A medical interpreter was utilized and when I would ask her a question, she would not respond with an answer to my question.  This is both confusing and frustrating for the medical interpreter and I.  With her permission, I called her daughter who was able to fill me in on what has been going on with her mother.  I strongly encouraged the patient establish care with a primary care provider and the daughter reported she would help her mom find one.  The patient was given the  opportunity to ask questions.  All questions answered to their satisfaction.  The patient is in agreement to this plan.    Final Clinical Impressions(s) / UC Diagnoses   Final diagnoses:  Erythema of face  History of DVT (deep vein thrombosis)  Medication refill     Discharge Instructions      Please clean your skin twice daily with mild soap and water; after patting dry, start using the metronidazole cream on your face twice daily (apply to clean skin)  Resume Xarelto  Please follow up with a PCP to address your other health concerns.  Go to the ER if symptoms worsen.     ED Prescriptions     Medication Sig Dispense Auth. Provider   metroNIDAZOLE (METROCREAM) 0.75 % cream Apply topically 2 (two) times daily. Apply thin film twice daily to face on clean skin 45 g Noemi Chapel A, NP   rivaroxaban (XARELTO) 20 MG TABS tablet Take 1 tablet (20 mg total) by mouth daily with supper. 30 tablet Eulogio Bear, NP      PDMP not reviewed this encounter.   Eulogio Bear, NP 03/14/23 1739
